# Patient Record
Sex: Male | Born: 1990 | Race: Black or African American | Hispanic: No | Marital: Single | Smoking: Current every day smoker
Health system: Southern US, Community
[De-identification: ages and names within clinical notes are randomized; demographics above are authoritative.]

## PROBLEM LIST (undated history)

## (undated) HISTORY — PX: WRIST SURGERY: SHX841

---
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
## 2009-10-04 ENCOUNTER — Emergency Department (HOSPITAL_BASED_OUTPATIENT_CLINIC_OR_DEPARTMENT_OTHER): Admission: EM | Admit: 2009-10-04 | Discharge: 2009-10-04 | Payer: Self-pay | Admitting: Emergency Medicine

## 2009-10-05 ENCOUNTER — Emergency Department (HOSPITAL_BASED_OUTPATIENT_CLINIC_OR_DEPARTMENT_OTHER): Admission: EM | Admit: 2009-10-05 | Discharge: 2009-10-05 | Payer: Self-pay | Admitting: Emergency Medicine

## 2009-11-08 ENCOUNTER — Emergency Department (HOSPITAL_BASED_OUTPATIENT_CLINIC_OR_DEPARTMENT_OTHER): Admission: EM | Admit: 2009-11-08 | Discharge: 2009-11-08 | Payer: Self-pay | Admitting: Emergency Medicine

## 2010-08-07 LAB — WOUND CULTURE: Culture: NO GROWTH

## 2010-08-09 ENCOUNTER — Emergency Department (INDEPENDENT_AMBULATORY_CARE_PROVIDER_SITE_OTHER): Payer: Self-pay

## 2010-08-09 ENCOUNTER — Emergency Department (HOSPITAL_BASED_OUTPATIENT_CLINIC_OR_DEPARTMENT_OTHER)
Admission: EM | Admit: 2010-08-09 | Discharge: 2010-08-09 | Disposition: A | Discharge status: Home / Self Care | Payer: Self-pay | Attending: Emergency Medicine | Admitting: Emergency Medicine

## 2010-08-09 DIAGNOSIS — F17200 Nicotine dependence, unspecified, uncomplicated: Secondary | ICD-10-CM | POA: Insufficient documentation

## 2010-08-09 DIAGNOSIS — R109 Unspecified abdominal pain: Secondary | ICD-10-CM

## 2010-08-09 DIAGNOSIS — R509 Fever, unspecified: Secondary | ICD-10-CM

## 2010-08-09 DIAGNOSIS — R197 Diarrhea, unspecified: Secondary | ICD-10-CM

## 2010-08-09 DIAGNOSIS — R112 Nausea with vomiting, unspecified: Secondary | ICD-10-CM

## 2010-08-09 LAB — COMPREHENSIVE METABOLIC PANEL
ALT: 34 U/L (ref 0–53)
Alkaline Phosphatase: 58 U/L (ref 39–117)
CO2: 22 mEq/L (ref 19–32)
Chloride: 105 mEq/L (ref 96–112)
GFR calc non Af Amer: >60 mL/min (ref >60)
Glucose, Bld: 115 mg/dL — ABNORMAL HIGH (ref 70–99)
Potassium: 3.9 mEq/L (ref 3.5–5.1)
Sodium: 141 mEq/L (ref 135–145)

## 2010-08-09 LAB — CBC
HCT: 46.7 % (ref 39.0–52.0)
Hemoglobin: 16 g/dL (ref 13.0–17.0)
WBC: 13.5 10*3/uL — ABNORMAL HIGH (ref 4.0–10.5)

## 2010-08-09 LAB — DIFFERENTIAL
Basophils Absolute: 0 10*3/uL (ref 0.0–0.1)
Lymphocytes Relative: 5 % — ABNORMAL LOW (ref 12–46)
Neutro Abs: 12.1 10*3/uL — ABNORMAL HIGH (ref 1.7–7.7)

## 2010-08-09 LAB — URINALYSIS, ROUTINE W REFLEX MICROSCOPIC
Ketones, ur: 15 mg/dL — AB
Nitrite: NEGATIVE
Urobilinogen, UA: 1 mg/dL (ref 0.0–1.0)
pH: 6 (ref 5.0–8.0)

## 2010-08-09 LAB — LIPASE, BLOOD: Lipase: 27 U/L (ref 23–300)

## 2010-08-09 MED ORDER — IOHEXOL 300 MG/ML  SOLN
100.0000 mL | Freq: Once | INTRAMUSCULAR | Status: AC | PRN
  Administered 2010-08-09: 100 mL via INTRAVENOUS

## 2010-10-03 ENCOUNTER — Emergency Department (HOSPITAL_BASED_OUTPATIENT_CLINIC_OR_DEPARTMENT_OTHER)
Admission: EM | Admit: 2010-10-03 | Discharge: 2010-10-03 | Disposition: A | Discharge status: Home / Self Care | Payer: No Typology Code available for payment source | Attending: Emergency Medicine | Admitting: Emergency Medicine

## 2010-10-03 DIAGNOSIS — Y92410 Unspecified street and highway as the place of occurrence of the external cause: Secondary | ICD-10-CM | POA: Insufficient documentation

## 2010-10-03 DIAGNOSIS — S335XXA Sprain of ligaments of lumbar spine, initial encounter: Secondary | ICD-10-CM | POA: Insufficient documentation

## 2011-03-20 ENCOUNTER — Encounter: Payer: Self-pay | Admitting: *Deleted

## 2011-03-20 ENCOUNTER — Emergency Department (HOSPITAL_BASED_OUTPATIENT_CLINIC_OR_DEPARTMENT_OTHER)
Admission: EM | Admit: 2011-03-20 | Discharge: 2011-03-20 | Disposition: A | Discharge status: Home / Self Care | Payer: Self-pay | Attending: Emergency Medicine | Admitting: Emergency Medicine

## 2011-03-20 DIAGNOSIS — W503XXA Accidental bite by another person, initial encounter: Secondary | ICD-10-CM

## 2011-03-20 DIAGNOSIS — S51809A Unspecified open wound of unspecified forearm, initial encounter: Secondary | ICD-10-CM | POA: Insufficient documentation

## 2011-03-20 DIAGNOSIS — F17200 Nicotine dependence, unspecified, uncomplicated: Secondary | ICD-10-CM | POA: Insufficient documentation

## 2011-03-20 MED ORDER — AMOXICILLIN-POT CLAVULANATE 875-125 MG PO TABS: 1.0000 | ORAL_TABLET | Freq: Two times a day (BID) | ORAL | Status: AC

## 2011-03-20 MED ORDER — AMOXICILLIN-POT CLAVULANATE 875-125 MG PO TABS
1.0000 | ORAL_TABLET | Freq: Once | ORAL | Status: AC
  Administered 2011-03-20: 1 via ORAL
  Filled 2011-03-20: qty 1

## 2011-03-20 MED ORDER — NAPROXEN 500 MG PO TABS: 500.0000 mg | ORAL_TABLET | Freq: Two times a day (BID) | ORAL | Status: AC
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
## 2011-05-08 ENCOUNTER — Emergency Department (INDEPENDENT_AMBULATORY_CARE_PROVIDER_SITE_OTHER): Payer: Self-pay

## 2011-05-08 ENCOUNTER — Encounter (HOSPITAL_BASED_OUTPATIENT_CLINIC_OR_DEPARTMENT_OTHER): Payer: Self-pay | Admitting: Emergency Medicine

## 2011-05-08 ENCOUNTER — Emergency Department (HOSPITAL_BASED_OUTPATIENT_CLINIC_OR_DEPARTMENT_OTHER)
Admission: EM | Admit: 2011-05-08 | Discharge: 2011-05-08 | Disposition: A | Discharge status: Home / Self Care | Payer: Self-pay | Attending: Emergency Medicine | Admitting: Emergency Medicine

## 2011-05-08 DIAGNOSIS — M549 Dorsalgia, unspecified: Secondary | ICD-10-CM | POA: Insufficient documentation

## 2011-05-08 DIAGNOSIS — J069 Acute upper respiratory infection, unspecified: Secondary | ICD-10-CM | POA: Insufficient documentation

## 2011-05-08 DIAGNOSIS — R05 Cough: Secondary | ICD-10-CM

## 2011-05-08 DIAGNOSIS — E669 Obesity, unspecified: Secondary | ICD-10-CM | POA: Insufficient documentation

## 2011-05-08 DIAGNOSIS — R509 Fever, unspecified: Secondary | ICD-10-CM

## 2011-05-08 DIAGNOSIS — M545 Low back pain: Secondary | ICD-10-CM

## 2011-05-08 MED ORDER — IBUPROFEN 400 MG PO TABS
600.0000 mg | ORAL_TABLET | Freq: Once | ORAL | Status: AC
  Administered 2011-05-08: 600 mg via ORAL
  Filled 2011-05-08: qty 1

## 2011-05-08 MED ORDER — HYDROCODONE-ACETAMINOPHEN 5-500 MG PO TABS: 1.0000 | ORAL_TABLET | Freq: Four times a day (QID) | ORAL | Status: AC | PRN

## 2011-05-08 MED ORDER — OXYCODONE-ACETAMINOPHEN 5-325 MG PO TABS
2.0000 | ORAL_TABLET | Freq: Once | ORAL | Status: AC
  Administered 2011-05-08: 2 via ORAL
  Filled 2011-05-08: qty 2

## 2011-05-08 MED ORDER — IBUPROFEN 600 MG PO TABS: 600.0000 mg | ORAL_TABLET | Freq: Three times a day (TID) | ORAL | Status: AC | PRN

## 2011-05-08 NOTE — ED Notes (Signed)
Pt states he has dry cough, diarrhea and lower back pain.  Some emesis on Sunday.  None since.  Some fever.  No dysuria.
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
## 2011-12-09 ENCOUNTER — Emergency Department (HOSPITAL_BASED_OUTPATIENT_CLINIC_OR_DEPARTMENT_OTHER)
Admission: EM | Admit: 2011-12-09 | Discharge: 2011-12-09 | Disposition: A | Discharge status: Home / Self Care | Payer: Self-pay | Attending: Emergency Medicine | Admitting: Emergency Medicine

## 2011-12-09 ENCOUNTER — Encounter (HOSPITAL_BASED_OUTPATIENT_CLINIC_OR_DEPARTMENT_OTHER): Payer: Self-pay | Admitting: *Deleted

## 2011-12-09 ENCOUNTER — Emergency Department (HOSPITAL_BASED_OUTPATIENT_CLINIC_OR_DEPARTMENT_OTHER): Payer: Self-pay

## 2011-12-09 DIAGNOSIS — M549 Dorsalgia, unspecified: Secondary | ICD-10-CM | POA: Insufficient documentation

## 2011-12-09 DIAGNOSIS — G43909 Migraine, unspecified, not intractable, without status migrainosus: Secondary | ICD-10-CM | POA: Insufficient documentation

## 2011-12-09 DIAGNOSIS — Z87891 Personal history of nicotine dependence: Secondary | ICD-10-CM | POA: Insufficient documentation

## 2011-12-09 DIAGNOSIS — N390 Urinary tract infection, site not specified: Secondary | ICD-10-CM | POA: Insufficient documentation

## 2011-12-09 LAB — URINALYSIS, ROUTINE W REFLEX MICROSCOPIC
Glucose, UA: NEGATIVE mg/dL
Ketones, ur: NEGATIVE mg/dL
pH: 6 (ref 5.0–8.0)

## 2011-12-09 LAB — URINE MICROSCOPIC-ADD ON

## 2011-12-09 MED ORDER — CIPROFLOXACIN HCL 500 MG PO TABS: 500.0000 mg | ORAL_TABLET | Freq: Two times a day (BID) | ORAL | Status: AC

## 2011-12-09 MED ORDER — KETOROLAC TROMETHAMINE 30 MG/ML IJ SOLN
60.0000 mg | Freq: Once | INTRAMUSCULAR | Status: AC
  Administered 2011-12-09: 60 mg via INTRAMUSCULAR

## 2011-12-09 MED ORDER — ONDANSETRON 4 MG PO TBDP
4.0000 mg | ORAL_TABLET | Freq: Once | ORAL | Status: AC
  Administered 2011-12-09: 4 mg via ORAL
  Filled 2011-12-09: qty 1

## 2011-12-09 MED ORDER — HYDROCODONE-ACETAMINOPHEN 5-500 MG PO TABS: 1.0000 | ORAL_TABLET | Freq: Four times a day (QID) | ORAL | Status: AC | PRN

## 2011-12-09 MED ORDER — KETOROLAC TROMETHAMINE 60 MG/2ML IM SOLN
INTRAMUSCULAR | Status: AC
  Filled 2011-12-09: qty 2

## 2011-12-09 MED ORDER — HYDROMORPHONE HCL PF 2 MG/ML IJ SOLN
2.0000 mg | Freq: Once | INTRAMUSCULAR | Status: AC
  Administered 2011-12-09: 2 mg via INTRAMUSCULAR
  Filled 2011-12-09: qty 1

## 2011-12-09 NOTE — ED Notes (Signed)
Frequent urinating,HA, generalized body aches down back and down L leg, no n/v, no fever
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
## 2012-07-03 ENCOUNTER — Emergency Department (HOSPITAL_BASED_OUTPATIENT_CLINIC_OR_DEPARTMENT_OTHER)
Admission: EM | Admit: 2012-07-03 | Discharge: 2012-07-03 | Disposition: A | Discharge status: Home / Self Care | Payer: Self-pay | Attending: Emergency Medicine | Admitting: Emergency Medicine

## 2012-07-03 ENCOUNTER — Encounter (HOSPITAL_BASED_OUTPATIENT_CLINIC_OR_DEPARTMENT_OTHER): Payer: Self-pay | Admitting: *Deleted

## 2012-07-03 ENCOUNTER — Emergency Department (HOSPITAL_BASED_OUTPATIENT_CLINIC_OR_DEPARTMENT_OTHER): Payer: Self-pay

## 2012-07-03 DIAGNOSIS — S8990XA Unspecified injury of unspecified lower leg, initial encounter: Secondary | ICD-10-CM | POA: Insufficient documentation

## 2012-07-03 DIAGNOSIS — X500XXA Overexertion from strenuous movement or load, initial encounter: Secondary | ICD-10-CM | POA: Insufficient documentation

## 2012-07-03 DIAGNOSIS — Z87891 Personal history of nicotine dependence: Secondary | ICD-10-CM | POA: Insufficient documentation

## 2012-07-03 DIAGNOSIS — Y939 Activity, unspecified: Secondary | ICD-10-CM | POA: Insufficient documentation

## 2012-07-03 DIAGNOSIS — M25569 Pain in unspecified knee: Secondary | ICD-10-CM

## 2012-07-03 DIAGNOSIS — S99919A Unspecified injury of unspecified ankle, initial encounter: Secondary | ICD-10-CM | POA: Insufficient documentation

## 2012-07-03 DIAGNOSIS — Y929 Unspecified place or not applicable: Secondary | ICD-10-CM | POA: Insufficient documentation

## 2012-07-03 MED ORDER — HYDROCODONE-ACETAMINOPHEN 5-325 MG PO TABS: 2.0000 | ORAL_TABLET | ORAL | Status: DC | PRN

## 2012-07-03 MED ORDER — HYDROCODONE-ACETAMINOPHEN 5-325 MG PO TABS
2.0000 | ORAL_TABLET | Freq: Once | ORAL | Status: AC
  Administered 2012-07-03: 2 via ORAL
  Filled 2012-07-03: qty 2
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
## 2012-07-03 NOTE — ED Provider Notes (Signed)
Medical screening examination/treatment/procedure(s) were performed by non-physician practitioner and as supervising physician I was immediately available for consultation/collaboration.   Carleene Cooper III, MD 07/03/12 2223
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
## 2013-01-14 ENCOUNTER — Encounter (HOSPITAL_BASED_OUTPATIENT_CLINIC_OR_DEPARTMENT_OTHER): Payer: Self-pay | Admitting: *Deleted

## 2013-01-14 ENCOUNTER — Emergency Department (HOSPITAL_BASED_OUTPATIENT_CLINIC_OR_DEPARTMENT_OTHER)
Admission: EM | Admit: 2013-01-14 | Discharge: 2013-01-14 | Disposition: A | Discharge status: Home / Self Care | Payer: Self-pay | Attending: Emergency Medicine | Admitting: Emergency Medicine

## 2013-01-14 DIAGNOSIS — L0231 Cutaneous abscess of buttock: Secondary | ICD-10-CM | POA: Insufficient documentation

## 2013-01-14 DIAGNOSIS — F17200 Nicotine dependence, unspecified, uncomplicated: Secondary | ICD-10-CM | POA: Insufficient documentation

## 2013-01-14 DIAGNOSIS — L0291 Cutaneous abscess, unspecified: Secondary | ICD-10-CM

## 2013-01-14 MED ORDER — TRAMADOL HCL 50 MG PO TABS: 50.0000 mg | ORAL_TABLET | Freq: Four times a day (QID) | ORAL | Status: DC | PRN

## 2013-01-14 MED ORDER — SULFAMETHOXAZOLE-TRIMETHOPRIM 800-160 MG PO TABS: 1.0000 | ORAL_TABLET | Freq: Two times a day (BID) | ORAL | Status: DC

## 2013-01-14 NOTE — ED Notes (Signed)
Pt c/o several abscess x 2 weeks

## 2013-01-14 NOTE — ED Provider Notes (Signed)
Medical screening examination/treatment/procedure(s) were performed by non-physician practitioner and as supervising physician I was immediately available for consultation/collaboration.   Gwyneth Sprout, MD 01/14/13 [phone]

## 2013-01-14 NOTE — ED Provider Notes (Signed)
CSN: [id number]     Arrival date & time 01/14/13  1402 History   First MD Initiated Contact with Patient 01/14/13 1419     Chief Complaint  Patient presents with  . Abscess   (Consider location/radiation/quality/duration/timing/severity/associated sxs/prior Treatment) HPI Comments: Patient is a 22 year old male who presents with a 2 week history of abscesses. One is located on his left lateral leg and the other started one week ago and is located on his left buttock. The one on his leg is not causing pain and "has been draining and getting better." The one on his buttock is painful and getting bigger. Patient not tried anything for relief. Palpation of the area makes the pain worse. Patient denies any other symptoms.    History reviewed. No pertinent past medical history. Past Surgical History  Procedure Laterality Date  . Wrist surgery      right   History reviewed. No pertinent family history. History  Substance Use Topics  . Smoking status: Current Every Day Smoker -- 0.50 packs/day    Types: Cigarettes  . Smokeless tobacco: Not on file  . Alcohol Use: No     Comment: occasional    Review of Systems  Skin: Positive for color change and wound.  All other systems reviewed and are negative.    Allergies  Review of patient's allergies indicates no known allergies.  Home Medications  No current outpatient prescriptions on file. BP 109/77  Pulse 92  Temp(Src) 98.8 F (37.1 C)  Resp 16  Ht 6\' 3"  (1.905 m)  Wt 300 lb (136.079 kg)  BMI 37.5 kg/m2  SpO2 97% Physical Exam  Nursing note and vitals reviewed. Constitutional: He is oriented to person, place, and time. He appears well-developed and well-nourished. No distress.  HENT:  Head: Normocephalic and atraumatic.  Eyes: Conjunctivae and EOM are normal.  Neck: Normal range of motion.  Cardiovascular: Normal rate and regular rhythm.  Exam reveals no gallop and no friction rub.   No murmur heard. Pulmonary/Chest: Effort  normal and breath sounds normal. He has no wheezes. He has no rales. He exhibits no tenderness.  Musculoskeletal: Normal range of motion.  Neurological: He is alert and oriented to person, place, and time. Coordination normal.  Speech is goal-oriented. Moves limbs without ataxia.   Skin: Skin is warm and dry.  Area of fluctuance with surrounding erythema on left buttock. Area of induration of left lateral lower leg without fluctuance.   Psychiatric: He has a normal mood and affect. His behavior is normal.    ED Course  Procedures (including critical care time)  INCISION AND DRAINAGE Performed by: Emilia Beck Consent: Verbal consent obtained. Risks and benefits: risks, benefits and alternatives were discussed Type: abscess  Body area: left buttock  Anesthesia: local infiltration  Incision was made with a scalpel.  Local anesthetic: lidocaine 2% without epinephrine  Anesthetic total: 2 ml  Complexity: complex Blunt dissection to break up loculations  Drainage: purulent  Drainage amount: 4 mL  Packing material: none  Patient tolerance: Patient tolerated the procedure well with no immediate complications.   Labs Review Labs Reviewed - No data to display Imaging Review No results found.  MDM   1. Abscess and cellulitis     2:30 PM Patient has abscess to left buttock and localized induration of left lateral lower leg. I will drain the abscess and patient will have PO antibiotics for cellulitis. Vitals stable and patient afebrile.   2:55 PM Abscess drained without complication.  Patient will be discharged with Bactrim and Tramadol. Patient instructed to return with worsening or concerning symptoms.     Emilia Beck, PA-C 01/14/13 1456

## 2013-09-24 ENCOUNTER — Encounter (HOSPITAL_BASED_OUTPATIENT_CLINIC_OR_DEPARTMENT_OTHER): Payer: Self-pay | Admitting: Emergency Medicine

## 2013-09-24 ENCOUNTER — Emergency Department (HOSPITAL_BASED_OUTPATIENT_CLINIC_OR_DEPARTMENT_OTHER)
Admission: EM | Admit: 2013-09-24 | Discharge: 2013-09-25 | Disposition: A | Discharge status: Home / Self Care | Payer: BC Managed Care – PPO | Attending: Emergency Medicine | Admitting: Emergency Medicine

## 2013-09-24 DIAGNOSIS — Z79899 Other long term (current) drug therapy: Secondary | ICD-10-CM | POA: Insufficient documentation

## 2013-09-24 DIAGNOSIS — E663 Overweight: Secondary | ICD-10-CM | POA: Insufficient documentation

## 2013-09-24 DIAGNOSIS — Z792 Long term (current) use of antibiotics: Secondary | ICD-10-CM | POA: Insufficient documentation

## 2013-09-24 DIAGNOSIS — F17200 Nicotine dependence, unspecified, uncomplicated: Secondary | ICD-10-CM | POA: Insufficient documentation

## 2013-09-24 DIAGNOSIS — M5430 Sciatica, unspecified side: Secondary | ICD-10-CM

## 2013-09-24 LAB — URINE MICROSCOPIC-ADD ON

## 2013-09-24 LAB — URINALYSIS, ROUTINE W REFLEX MICROSCOPIC
Bilirubin Urine: NEGATIVE
Glucose, UA: NEGATIVE mg/dL
Hgb urine dipstick: NEGATIVE
KETONES UR: NEGATIVE mg/dL
NITRITE: NEGATIVE
Protein, ur: NEGATIVE mg/dL
Specific Gravity, Urine: 1.023 (ref 1.005–1.030)
Urobilinogen, UA: 1 mg/dL (ref 0.0–1.0)
pH: 6 (ref 5.0–8.0)

## 2013-09-24 MED ORDER — OXYCODONE-ACETAMINOPHEN 5-325 MG PO TABS
1.0000 | ORAL_TABLET | Freq: Four times a day (QID) | ORAL | Status: DC | PRN
  Administered 2013-09-24: 1 via ORAL
  Filled 2013-09-24: qty 1

## 2013-09-24 MED ORDER — KETOROLAC TROMETHAMINE 60 MG/2ML IM SOLN
60.0000 mg | Freq: Once | INTRAMUSCULAR | Status: AC
  Administered 2013-09-24: 60 mg via INTRAMUSCULAR
  Filled 2013-09-24: qty 2

## 2013-09-24 NOTE — ED Notes (Signed)
C/o lower back pain radiating to upper back down leg x 1 month,  Took tylenol 1-2 times w no relief  Denies inj

## 2013-09-24 NOTE — ED Notes (Signed)
Lower back pain with radiation into his left leg x 1 month.

## 2013-09-24 NOTE — ED Provider Notes (Signed)
CSN: [id number]     Arrival date & time 09/24/13  2122 History  This chart was scribed for Dustin Batonourtney F Horton, MD by Luisa DagoPriscilla Tutu, ED Scribe. This patient was seen in room MH08/MH08 and the patient's care was started at 11:16 PM.    Chief Complaint  Patient presents with  . Back Pain   The history is provided by the patient. No language interpreter was used.   HPI Comments: Lanier PrudeDiante Q Pearce is a 23 y.o. male who presents to the Emergency Department complaining of worsening back pain that started 1 month ago. Pt states that the pain is mainly located in her lower back and it radiates down into her left leg. He states that he hasn't been doing any heavy lifting, but he states that the pain is worsened by sitting. Pt reports taking Tylenol with no relief. He denies any paraesthesia, recent injury, IV drug use, steroid use, bowel or bladder incontinence.   History reviewed. No pertinent past medical history. Past Surgical History  Procedure Laterality Date  . Wrist surgery      right   No family history on file. History  Substance Use Topics  . Smoking status: Current Every Day Smoker -- 0.50 packs/day    Types: Cigarettes  . Smokeless tobacco: Not on file  . Alcohol Use: No     Comment: occasional    Review of Systems  Genitourinary:       Denies urinary retention  Musculoskeletal: Positive for back pain.  Neurological: Negative for weakness and numbness.  All other systems reviewed and are negative.     Allergies  Review of patient's allergies indicates no known allergies.  Home Medications   Prior to Admission medications   Medication Sig Start Date End Date Taking? Authorizing Provider  sulfamethoxazole-trimethoprim (SEPTRA DS) 800-160 MG per tablet Take 1 tablet by mouth every 12 (twelve) hours. 01/14/13   Kaitlyn Szekalski, PA-C  traMADol (ULTRAM) 50 MG tablet Take 1 tablet (50 mg total) by mouth every 6 (six) hours as needed for pain. 01/14/13   Kaitlyn Szekalski, PA-C    BP 127/89  Pulse 65  Temp(Src) 98.1 F (36.7 C) (Oral)  Resp 18  Ht 6\' 4"  (1.93 m)  Wt 300 lb (136.079 kg)  BMI 36.53 kg/m2  SpO2 97%  Physical Exam  Nursing note and vitals reviewed. Constitutional: He is oriented to person, place, and time. He appears well-developed and well-nourished. No distress.  Overweight  HENT:  Head: Normocephalic and atraumatic.  Cardiovascular: Normal rate, regular rhythm and normal heart sounds.   No murmur heard. Pulmonary/Chest: Effort normal and breath sounds normal. No respiratory distress. He has no wheezes.  Musculoskeletal: He exhibits no edema.  Tenderness to palpation over the left SI joint, no midline lumbar tenderness, positive straight leg raise  Lymphadenopathy:    He has no cervical adenopathy.  Neurological: He is alert and oriented to person, place, and time.  Skin: Skin is warm and dry.  Psychiatric: He has a normal mood and affect.    ED Course  Procedures (including critical care time)  DIAGNOSTIC STUDIES: Oxygen Saturation is 97% on RA, normal by my interpretation.    COORDINATION OF CARE: 11:19 PM -Patient / Family / Caregiver informed of clinical course, understand medical decision-making process, and agree with plan.  Labs Review Labs Reviewed  URINALYSIS, ROUTINE W REFLEX MICROSCOPIC - Abnormal; Notable for the following:    Leukocytes, UA SMALL (*)    All other components within normal limits  URINE MICROSCOPIC-ADD ON - Abnormal; Notable for the following:    Bacteria, UA FEW (*)    All other components within normal limits    Imaging Review No results found.   EKG Interpretation None      MDM   Final diagnoses:  Sciatica    Patient presents with one month history of back pain. He is nontoxic and afebrile. No other red flags of back pain. History and physical exam most consistent with sciatica. Patient was given Percocet.  No indication for imaging at this time. Discuss with patient  anti-inflammatories. Patient will be given a Medrol Dosepak at discharge. Patient was also given referral to sports medicine.  After history, exam, and medical workup I feel the patient has been appropriately medically screened and is safe for discharge home. Pertinent diagnoses were discussed with the patient. Patient was given return precautions.   I personally performed the services described in this documentation, which was scribed in my presence. The recorded information has been reviewed and is accurate.    Dustin Batonourtney F Horton, MD 09/25/13 [phone]

## 2013-09-25 MED ORDER — METHYLPREDNISOLONE (PAK) 4 MG PO TABS: ORAL_TABLET | ORAL | Status: DC

## 2013-09-25 MED ORDER — OXYCODONE-ACETAMINOPHEN 5-325 MG PO TABS: 1.0000 | ORAL_TABLET | Freq: Four times a day (QID) | ORAL | Status: DC | PRN

## 2013-09-25 NOTE — Discharge Instructions (Signed)
Sciatica °Sciatica is pain, weakness, numbness, or tingling along the path of the sciatic nerve. The nerve starts in the lower back and runs down the back of each leg. The nerve controls the muscles in the lower leg and in the back of the knee, while also providing sensation to the back of the thigh, lower leg, and the sole of your foot. Sciatica is a symptom of another medical condition. For instance, nerve damage or certain conditions, such as a herniated disk or bone spur on the spine, pinch or put pressure on the sciatic nerve. This causes the pain, weakness, or other sensations normally associated with sciatica. Generally, sciatica only affects one side of the body. °CAUSES  °· Herniated or slipped disc. °· Degenerative disk disease. °· A pain disorder involving the narrow muscle in the buttocks (piriformis syndrome). °· Pelvic injury or fracture. °· Pregnancy. °· Tumor (rare). °SYMPTOMS  °Symptoms can vary from mild to very severe. The symptoms usually travel from the low back to the buttocks and down the back of the leg. Symptoms can include: °· Mild tingling or dull aches in the lower back, leg, or hip. °· Numbness in the back of the calf or sole of the foot. °· Burning sensations in the lower back, leg, or hip. °· Sharp pains in the lower back, leg, or hip. °· Leg weakness. °· Severe back pain inhibiting movement. °These symptoms may get worse with coughing, sneezing, laughing, or prolonged sitting or standing. Also, being overweight may worsen symptoms. °DIAGNOSIS  °Your caregiver will perform a physical exam to look for common symptoms of sciatica. He or she may ask you to do certain movements or activities that would trigger sciatic nerve pain. Other tests may be performed to find the cause of the sciatica. These may include: °· Blood tests. °· X-rays. °· Imaging tests, such as an MRI or CT scan. °TREATMENT  °Treatment is directed at the cause of the sciatic pain. Sometimes, treatment is not necessary  and the pain and discomfort goes away on its own. If treatment is needed, your caregiver may suggest: °· Over-the-counter medicines to relieve pain. °· Prescription medicines, such as anti-inflammatory medicine, muscle relaxants, or narcotics. °· Applying heat or ice to the painful area. °· Steroid injections to lessen pain, irritation, and inflammation around the nerve. °· Reducing activity during periods of pain. °· Exercising and stretching to strengthen your abdomen and improve flexibility of your spine. Your caregiver may suggest losing weight if the extra weight makes the back pain worse. °· Physical therapy. °· Surgery to eliminate what is pressing or pinching the nerve, such as a bone spur or part of a herniated disk. °HOME CARE INSTRUCTIONS  °· Only take over-the-counter or prescription medicines for pain or discomfort as directed by your caregiver. °· Apply ice to the affected area for 20 minutes, 3 4 times a day for the first 48 72 hours. Then try heat in the same way. °· Exercise, stretch, or perform your usual activities if these do not aggravate your pain. °· Attend physical therapy sessions as directed by your caregiver. °· Keep all follow-up appointments as directed by your caregiver. °· Do not wear high heels or shoes that do not provide proper support. °· Check your mattress to see if it is too soft. A firm mattress may lessen your pain and discomfort. °SEEK IMMEDIATE MEDICAL CARE IF:  °· You lose control of your bowel or bladder (incontinence). °· You have increasing weakness in the lower back,   pelvis, buttocks, or legs. °· You have redness or swelling of your back. °· You have a burning sensation when you urinate. °· You have pain that gets worse when you lie down or awakens you at night. °· Your pain is worse than you have experienced in the past. °· Your pain is lasting longer than 4 weeks. °· You are suddenly losing weight without reason. °MAKE SURE YOU: °· Understand these  instructions. °· Will watch your condition. °· Will get help right away if you are not doing well or get worse. °Document Released: 05/01/2001 Document Revised: 11/06/2011 Document Reviewed: 09/16/2011 °[employer]® Patient Information ©2014 [employer]. ° °

## 2013-10-19 ENCOUNTER — Emergency Department (HOSPITAL_BASED_OUTPATIENT_CLINIC_OR_DEPARTMENT_OTHER)
Admission: EM | Admit: 2013-10-19 | Discharge: 2013-10-19 | Disposition: A | Discharge status: Home / Self Care | Payer: BC Managed Care – PPO | Attending: Emergency Medicine | Admitting: Emergency Medicine

## 2013-10-19 ENCOUNTER — Encounter (HOSPITAL_BASED_OUTPATIENT_CLINIC_OR_DEPARTMENT_OTHER): Payer: Self-pay | Admitting: Emergency Medicine

## 2013-10-19 DIAGNOSIS — G5700 Lesion of sciatic nerve, unspecified lower limb: Secondary | ICD-10-CM | POA: Insufficient documentation

## 2013-10-19 DIAGNOSIS — M545 Low back pain, unspecified: Secondary | ICD-10-CM | POA: Insufficient documentation

## 2013-10-19 DIAGNOSIS — M549 Dorsalgia, unspecified: Secondary | ICD-10-CM

## 2013-10-19 DIAGNOSIS — F17200 Nicotine dependence, unspecified, uncomplicated: Secondary | ICD-10-CM | POA: Insufficient documentation

## 2013-10-19 MED ORDER — NAPROXEN 500 MG PO TABS: 500.0000 mg | ORAL_TABLET | Freq: Two times a day (BID) | ORAL | Status: DC

## 2013-10-19 MED ORDER — HYDROCODONE-ACETAMINOPHEN 5-325 MG PO TABS: 2.0000 | ORAL_TABLET | ORAL | Status: DC | PRN

## 2013-10-19 NOTE — ED Notes (Signed)
Pt treated 3 weeks ago for sciatica.  Reports no change in pain.  (L) sided pain

## 2013-10-19 NOTE — ED Notes (Signed)
D/c home with ride- pt directed to pharmacy to pick prescriptions- rx given to pt by Dr. Fayrene Fearing for PT
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
## 2013-10-19 NOTE — Discharge Instructions (Signed)
Call number on Rx for [facility] patient [facility] (Physical therapy).  Back Pain, Adult Back pain is very common. The pain often gets better over time. The cause of back pain is usually not dangerous. Most people can learn to manage their back pain on their own.  HOME CARE   Stay active. Start with short walks on flat ground if you can. Try to walk farther each day.  Do not sit, drive, or stand in one place for more than 30 minutes. Do not stay in bed.  Do not avoid exercise or work. Activity can help your back heal faster.  Be careful when you bend or lift an object. Bend at your knees, keep the object close to you, and do not twist.  Sleep on a firm mattress. Lie on your side, and bend your knees. If you lie on your back, put a pillow under your knees.  Only take medicines as told by your doctor.  Put ice on the injured area.  Put ice in a plastic bag.  Place a towel between your skin and the bag.  Leave the ice on for 15-20 minutes, 03-04 times a day for the first 2 to 3 days. After that, you can switch between ice and heat packs.  Ask your doctor about back exercises or massage.  Avoid feeling anxious or stressed. Find good ways to deal with stress, such as exercise. GET HELP RIGHT AWAY IF:   Your pain does not go away with rest or medicine.  Your pain does not go away in 1 week.  You have new problems.  You do not feel well.  The pain spreads into your legs.  You cannot control when you poop (bowel movement) or pee (urinate).  Your arms or legs feel weak or lose feeling (numbness).  You feel sick to your stomach (nauseous) or throw up (vomit).  You have belly (abdominal) pain.  You feel like you may pass out (faint). MAKE SURE YOU:   Understand these instructions.  Will watch your condition.  Will get help right away if you are not doing well or get worse. Document Released: 10/24/2007 Document Revised: 07/30/2011 Document Reviewed:  09/25/2010 [facility] Patient Information 2014 [employer], [place].  Piriformis Syndrome with Rehab Piriformis syndrome is a condition the affects the nervous system in the area of the hip, and is characterized by pain and possibly a loss of feeling in the backside (posterior) thigh that may extend down the entire length of the leg. The symptoms are caused by an increase in pressure on the sciatic nerve by the piriformis muscle, which is on the back of the hip and is responsible for externally rotating the hip. The sciatic nerve and its branches connect to much of the leg. Normally the sciatic nerve runs between the piriformis muscle and other muscles. However, in certain individuals the nerve runs through the muscle, which causes an increase in pressure on the nerve and results in the symptoms of piriformis syndrome. SYMPTOMS   Pain, tingling, numbness, or burning in the back of the thigh that may also extend down the entire leg.  Occasionally, tenderness in the buttock.  Loss of function of the leg.  Pain that worsens when using the piriformis muscle (running, jumping, or stairs).  Pain that increases with prolonged sitting.  Pain that is lessened by laying flat on the back. CAUSES   Piriformis syndrome is the result of an increase in pressure placed on the sciatic nerve. Often times piriformis syndrome  is an overuse injury.  Stress placed on the nerve from a sudden increase in the intensity, frequency, or duration of training.  Compensation of other extremity injuries. RISK INCREASES WITH:  Sports that involve the piriformis muscle (running, walking or jumping).  You are born with (congenital) a defect in which the sciatic nerve passes through the muscle. PREVENTION  Warm up and stretch properly before activity.  Allow for adequate recovery between workouts.  Maintain physical fitness:  Strength, flexibility, and endurance.  Cardiovascular fitness. PROGNOSIS  If treated  properly, then the symptoms of piriformis syndrome usually resolve in 2 to 6 weeks. RELATED COMPLICATIONS   Persistent and possibly permanent pain and numbness in the lower extremity.  Weakness of the extremity that may progress to disability and inability to compete. TREATMENT  The most effective treatment for piriformis syndrome is rest from any activities that aggravate the symptoms. Ice and pain medication may help reduce pain and inflammation. The use of strengthening and stretching exercises may help reduce pain with activity. These exercises may be performed at home or with a therapist. A referral to a therapist may be given for further evaluation and treatment, such as ultrasound. Corticosteroid injections may be given to reduce inflammation that is causing pressure to be placed on the sciatic nerve. If non-surgical (conservative) treatment is unsuccessful, then surgery may be recommended.  MEDICATION   If pain medication is necessary, then nonsteroidal anti-inflammatory medications, such as aspirin and ibuprofen, or other minor pain relievers, such as acetaminophen, are often recommended.  Do not take pain medication for 7 days before surgery.  Prescription pain relievers may be given if deemed necessary by your caregiver. Use only as directed and only as much as you need.  Corticosteroid injections may be given by your caregiver. These injections should be reserved for the most serious cases, because they may only be given a certain number of times. HEAT AND COLD:   Cold treatment (icing) relieves pain and reduces inflammation. Cold treatment should be applied for 10 to 15 minutes every 2 to 3 hours for inflammation and pain and immediately after any activity that aggravates your symptoms. Use ice packs or massage the area with a piece of ice (ice massage).  Heat treatment may be used prior to performing the stretching and strengthening activities prescribed by your caregiver, physical  therapist, or athletic trainer. Use a heat pack or soak the injury in warm water. SEEK IMMEDIATE MEDICAL CARE IF:  Treatment seems to offer no benefit, or the condition worsens.  Any medications produce adverse side effects.

## 2013-11-30 ENCOUNTER — Emergency Department (HOSPITAL_BASED_OUTPATIENT_CLINIC_OR_DEPARTMENT_OTHER)
Admission: EM | Admit: 2013-11-30 | Discharge: 2013-11-30 | Disposition: A | Discharge status: Home / Self Care | Payer: BC Managed Care – PPO | Attending: Emergency Medicine | Admitting: Emergency Medicine

## 2013-11-30 ENCOUNTER — Encounter (HOSPITAL_BASED_OUTPATIENT_CLINIC_OR_DEPARTMENT_OTHER): Payer: Self-pay | Admitting: Emergency Medicine

## 2013-11-30 DIAGNOSIS — M545 Low back pain: Secondary | ICD-10-CM

## 2013-11-30 DIAGNOSIS — Z79899 Other long term (current) drug therapy: Secondary | ICD-10-CM | POA: Insufficient documentation

## 2013-11-30 DIAGNOSIS — M5430 Sciatica, unspecified side: Secondary | ICD-10-CM | POA: Insufficient documentation

## 2013-11-30 DIAGNOSIS — N390 Urinary tract infection, site not specified: Secondary | ICD-10-CM | POA: Insufficient documentation

## 2013-11-30 DIAGNOSIS — Z791 Long term (current) use of non-steroidal anti-inflammatories (NSAID): Secondary | ICD-10-CM | POA: Insufficient documentation

## 2013-11-30 DIAGNOSIS — F17200 Nicotine dependence, unspecified, uncomplicated: Secondary | ICD-10-CM | POA: Insufficient documentation

## 2013-11-30 LAB — URINALYSIS, ROUTINE W REFLEX MICROSCOPIC
Bilirubin Urine: NEGATIVE
Glucose, UA: NEGATIVE mg/dL
Hgb urine dipstick: NEGATIVE
Ketones, ur: NEGATIVE mg/dL
NITRITE: NEGATIVE
PROTEIN: NEGATIVE mg/dL
SPECIFIC GRAVITY, URINE: 1.011 (ref 1.005–1.030)
UROBILINOGEN UA: 1 mg/dL (ref 0.0–1.0)
pH: 7 (ref 5.0–8.0)

## 2013-11-30 LAB — URINE MICROSCOPIC-ADD ON

## 2013-11-30 MED ORDER — OXYCODONE-ACETAMINOPHEN 5-325 MG PO TABS
1.0000 | ORAL_TABLET | ORAL | Status: DC | PRN
Start: 2013-11-30 — End: 2014-05-24

## 2013-11-30 MED ORDER — CEPHALEXIN 500 MG PO CAPS: 500.0000 mg | ORAL_CAPSULE | Freq: Four times a day (QID) | ORAL | Status: DC

## 2013-11-30 MED ORDER — METHOCARBAMOL 500 MG PO TABS: 1000.0000 mg | ORAL_TABLET | Freq: Two times a day (BID) | ORAL | Status: DC
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
## 2013-11-30 NOTE — ED Provider Notes (Signed)
CSN: [id number]     Arrival date & time 11/30/13  [id number] History   First MD Initiated Contact with Patient 11/30/13 [phone]     Chief Complaint  Patient presents with  . Back Pain     (Consider location/radiation/quality/duration/timing/severity/associated sxs/prior Treatment) Patient is a 23 y.o. male presenting with back pain. The history is provided by the patient.  Back Pain  patient here 24 hours of left-sided back pain. Denies any dysuria hematuria. No fever or chills. History of back pain in the past has been seen multiple times for same. Pain does radiate down his left leg and is characterized as sharp and worse with standing. 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 Denies any new injury to his back. No red flags for  cauda equina. Symptoms persisted and no treatment used prior to arrival. They're also better with rest.  History reviewed. No pertinent past medical history. Past Surgical History  Procedure Laterality Date  . Wrist surgery      right   History reviewed. No pertinent family history. History  Substance Use Topics  . Smoking status: Current Every Day Smoker -- 0.50 packs/day    Types: Cigarettes  . Smokeless tobacco: Not on file  . Alcohol Use: No     Comment: occasional    Review of Systems  Musculoskeletal: Positive for back pain.  All other systems reviewed and are negative.     Allergies  Review of patient's allergies indicates no known allergies.  Home Medications   Prior to Admission medications   Medication Sig Start Date End Date Taking? Authorizing Provider  cephALEXin (KEFLEX) 500 MG capsule Take 1 capsule (500 mg total) by mouth 4 (four) times daily. 11/30/13   Toy BakerAnthony T Jerusalem Brownstein, MD  HYDROcodone-acetaminophen (NORCO/VICODIN) 5-325 MG per tablet Take 2 tablets by mouth every 4 (four) hours as needed. 10/19/13   Rolland PorterMark James, MD  methocarbamol (ROBAXIN) 500 MG tablet Take 2 tablets (1,000 mg total) by  mouth 2 (two) times daily. 11/30/13   Toy BakerAnthony T Macallan Ord, MD  naproxen (NAPROSYN) 500 MG tablet Take 1 tablet (500 mg total) by mouth 2 (two) times daily. 10/19/13   Rolland PorterMark James, MD  oxyCODONE-acetaminophen (PERCOCET/ROXICET) 5-325 MG per tablet Take 1-2 tablets by mouth every 4 (four) hours as needed for severe pain. 11/30/13   Toy BakerAnthony T Katheline Brendlinger, MD   BP 131/74  Pulse 65  Temp(Src) 99.7 F (37.6 C) (Oral)  Resp 16  Ht 6' 3.5" (1.918 m)  Wt 310 lb (140.615 kg)  BMI 38.22 kg/m2  SpO2 99% Physical Exam  Nursing note and vitals reviewed. Constitutional: He is oriented to person, place, and time. He appears well-developed and well-nourished.  Non-toxic appearance. No distress.  HENT:  Head: Normocephalic and atraumatic.  Eyes: Conjunctivae, EOM and lids are normal. Pupils are equal, round, and reactive to light.  Neck: Normal range of motion. Neck supple. No tracheal deviation present. No mass present.  Cardiovascular: Normal rate, regular rhythm and normal heart sounds.  Exam reveals no gallop.   No murmur heard. Pulmonary/Chest: Effort normal and breath sounds normal. No stridor. No respiratory distress. He has no decreased breath sounds. He has no wheezes. He has no rhonchi. He has no rales.  Abdominal: Soft. Normal appearance and bowel sounds are normal. He exhibits no distension. There is no tenderness. There is no rebound and no CVA tenderness.  Musculoskeletal: Normal range of motion. He exhibits no edema and  no tenderness.       Back:  Neurological: He is alert and oriented to person, place, and time. He has normal strength. No cranial nerve deficit or sensory deficit. GCS eye subscore is 4. GCS verbal subscore is 5. GCS motor subscore is 6.  Skin: Skin is warm and dry. No abrasion and no rash noted.  Psychiatric: He has a normal mood and affect. His speech is normal and behavior is normal.    ED Course  Procedures (including critical care time) Labs Review Labs Reviewed  URINALYSIS,  ROUTINE W REFLEX MICROSCOPIC - Abnormal; Notable for the following:    Leukocytes, UA SMALL (*)    All other components within normal limits  URINE MICROSCOPIC-ADD ON - Abnormal; Notable for the following:    Squamous Epithelial / LPF FEW (*)    All other components within normal limits    Imaging Review No results found.   EKG Interpretation None      MDM   Final diagnoses:  UTI (lower urinary tract infection)  Low back pain, unspecified back pain laterality, with sciatica presence unspecified    Patient with evidence of UTI on urinalysis and we treated for such. He also treated for suspected lumbar disc disease. Encouraged to followup with his Dr.    Toy Baker, MD 11/30/13 [phone]

## 2013-11-30 NOTE — Discharge Instructions (Signed)
Back Pain, Adult °Low back pain is very common. About 1 in 5 people have back pain. The cause of low back pain is rarely dangerous. The pain often gets better over time. About half of people with a sudden onset of back pain feel better in just 2 weeks. About 8 in 10 people feel better by 6 weeks.  °CAUSES °Some common causes of back pain include: °· Strain of the muscles or ligaments supporting the spine. °· Wear and tear (degeneration) of the spinal discs. °· Arthritis. °· Direct injury to the back. °DIAGNOSIS °Most of the time, the direct cause of low back pain is not known. However, back pain can be treated effectively even when the exact cause of the pain is unknown. Answering your caregiver's questions about your overall health and symptoms is one of the most accurate ways to make sure the cause of your pain is not dangerous. If your caregiver needs more information, he or she may order lab work or imaging tests (X-rays or MRIs). However, even if imaging tests show changes in your back, this usually does not require surgery. °HOME CARE INSTRUCTIONS °For many people, back pain returns. Since low back pain is rarely dangerous, it is often a condition that people can learn to manage on their own.  °· Remain active. It is stressful on the back to sit or stand in one place. Do not sit, drive, or stand in one place for more than 30 minutes at a time. Take short walks on level surfaces as soon as pain allows. Try to increase the length of time you walk each day. °· Do not stay in bed. Resting more than 1 or 2 days can delay your recovery. °· Do not avoid exercise or work. Your body is made to move. It is not dangerous to be active, even though your back may hurt. Your back will likely heal faster if you return to being active before your pain is gone. °· Pay attention to your body when you  bend and lift. Many people have less discomfort when lifting if they bend their knees, keep the load close to their bodies, and  avoid twisting. Often, the most comfortable positions are those that put less stress on your recovering back. °· Find a comfortable position to sleep. Use a firm mattress and lie on your side with your knees slightly bent. If you lie on your back, put a pillow under your knees. °· Only take over-the-counter or prescription medicines as directed by your caregiver. Over-the-counter medicines to reduce pain and inflammation are often the most helpful. Your caregiver may prescribe muscle relaxant drugs. These medicines help dull your pain so you can more quickly return to your normal activities and healthy exercise. °· Put ice on the injured area. °¨ Put ice in a plastic bag. °¨ Place a towel between your skin and the bag. °¨ Leave the ice on for 15-20 minutes, 03-04 times a day for the first 2 to 3 days. After that, ice and heat may be alternated to reduce pain and spasms. °· Ask your caregiver about trying back exercises and gentle massage. This may be of some benefit. °· Avoid feeling anxious or stressed. Stress increases muscle tension and can worsen back pain. It is important to recognize when you are anxious or stressed and learn ways to manage it. Exercise is a great option. °SEEK MEDICAL CARE IF: °· You have pain that is not relieved with rest or medicine. °· You have pain that does not improve in 1 week. °· You have new symptoms. °· You are generally not feeling well. °SEEK   IMMEDIATE MEDICAL CARE IF:  °· You have pain that radiates from your back into your legs. °· You develop new bowel or bladder control problems. °· You have unusual weakness or numbness in your arms or legs. °· You develop nausea or vomiting. °· You develop abdominal pain. °· You feel faint. °Document Released: 05/07/2005 Document Revised: 11/06/2011 Document Reviewed: 09/25/2010 °ExitCare® Patient Information ©2015 [employer]. This information is not intended to replace advice given to you by your health care provider. Make sure you  discuss any questions you have with your health care provider. ° °Urinary Tract Infection °Urinary tract infections (UTIs) can develop anywhere along your urinary tract. Your urinary tract is your body's drainage system for removing wastes and extra water. Your urinary tract includes two kidneys, two ureters, a bladder, and a urethra. Your kidneys are a pair of bean-shaped organs. Each kidney is about the size of your fist. They are located below your ribs, one on each side of your spine. °CAUSES °Infections are caused by microbes, which are microscopic organisms, including fungi, viruses, and bacteria. These organisms are so small that they can only be seen through a microscope. Bacteria are the microbes that most commonly cause UTIs. °SYMPTOMS  °Symptoms of UTIs may vary by age and gender of the patient and by the location of the infection. Symptoms in young women typically include a frequent and intense urge to urinate and a painful, burning feeling in the bladder or urethra during urination. Older women and men are more likely to be tired, shaky, and weak and have muscle aches and abdominal pain. A fever may mean the infection is in your kidneys. Other symptoms of a kidney infection include pain in your back or sides below the ribs, nausea, and vomiting. °DIAGNOSIS °To diagnose a UTI, your caregiver will ask you about your symptoms. Your caregiver also will ask to provide a urine sample. The urine sample will be tested for bacteria and white blood cells. White blood cells are made by your body to help fight infection. °TREATMENT  °Typically, UTIs can be treated with medication. Because most UTIs are caused by a bacterial infection, they usually can be treated with the use of antibiotics. The choice of antibiotic and length of treatment depend on your symptoms and the type of bacteria causing your infection. °HOME CARE INSTRUCTIONS °· If you were prescribed antibiotics, take them exactly as your caregiver  instructs you. Finish the medication even if you feel better after you have only taken some of the medication. °· Drink enough water and fluids to keep your urine clear or pale yellow. °· Avoid caffeine, tea, and carbonated beverages. They tend to irritate your bladder. °· Empty your bladder often. Avoid holding urine for long periods of time. °· Empty your bladder before and after sexual intercourse. °· After a bowel movement, women should cleanse from front to back. Use each tissue only once. °SEEK MEDICAL CARE IF:  °· You have back pain. °· You develop a fever. °· Your symptoms do not begin to resolve within 3 days. °SEEK IMMEDIATE MEDICAL CARE IF:  °· You have severe back pain or lower abdominal pain. °· You develop chills. °· You have nausea or vomiting. °· You have continued burning or discomfort with urination. °MAKE SURE YOU:  °· Understand these instructions. °· Will watch your condition. °· Will get help right away if you are not doing well or get worse. °Document Released: 02/14/2005 Document Revised: 11/06/2011 Document Reviewed: 06/15/2011 °ExitCare® Patient Information ©  2015 [employer]. This information is not intended to replace advice given to you by your health care provider. Make sure you discuss any questions you have with your health care provider. ° °

## 2014-05-23 ENCOUNTER — Encounter (HOSPITAL_BASED_OUTPATIENT_CLINIC_OR_DEPARTMENT_OTHER): Payer: Self-pay | Admitting: *Deleted

## 2014-05-23 DIAGNOSIS — Z791 Long term (current) use of non-steroidal anti-inflammatories (NSAID): Secondary | ICD-10-CM | POA: Insufficient documentation

## 2014-05-23 DIAGNOSIS — Z792 Long term (current) use of antibiotics: Secondary | ICD-10-CM | POA: Insufficient documentation

## 2014-05-23 DIAGNOSIS — Z720 Tobacco use: Secondary | ICD-10-CM | POA: Insufficient documentation

## 2014-05-23 DIAGNOSIS — J029 Acute pharyngitis, unspecified: Secondary | ICD-10-CM | POA: Diagnosis not present

## 2014-05-23 DIAGNOSIS — Z79899 Other long term (current) drug therapy: Secondary | ICD-10-CM | POA: Insufficient documentation

## 2014-05-23 LAB — RAPID STREP SCREEN (MED CTR MEBANE ONLY): Streptococcus, Group A Screen (Direct): NEGATIVE

## 2014-05-23 MED ORDER — OXYCODONE-ACETAMINOPHEN 5-325 MG PO TABS
1.0000 | ORAL_TABLET | Freq: Once | ORAL | Status: AC
  Administered 2014-05-23: 1 via ORAL
  Filled 2014-05-23: qty 1

## 2014-05-23 MED ORDER — IBUPROFEN 400 MG PO TABS
400.0000 mg | ORAL_TABLET | Freq: Once | ORAL | Status: AC
  Administered 2014-05-23: 400 mg via ORAL
  Filled 2014-05-23: qty 1

## 2014-05-23 NOTE — ED Notes (Addendum)
C/o sore throat, onset 12/31, hurts to swallow, decreased PO input d/t pain. [facility] in jail on [place], seen by correctional nurse for same, do not know results of test done then.

## 2014-05-24 ENCOUNTER — Emergency Department (HOSPITAL_BASED_OUTPATIENT_CLINIC_OR_DEPARTMENT_OTHER)
Admission: EM | Admit: 2014-05-24 | Discharge: 2014-05-24 | Disposition: A | Discharge status: Home / Self Care | Payer: Managed Care, Other (non HMO) | Attending: Emergency Medicine | Admitting: Emergency Medicine

## 2014-05-24 DIAGNOSIS — J029 Acute pharyngitis, unspecified: Secondary | ICD-10-CM

## 2014-05-24 LAB — MONONUCLEOSIS SCREEN: Mono Screen: NEGATIVE

## 2014-05-24 MED ORDER — SODIUM CHLORIDE 0.9 % IV BOLUS (SEPSIS)
1000.0000 mL | Freq: Once | INTRAVENOUS | Status: AC
  Administered 2014-05-24: 1000 mL via INTRAVENOUS

## 2014-05-24 MED ORDER — CLINDAMYCIN HCL 300 MG PO CAPS: 300.0000 mg | ORAL_CAPSULE | Freq: Three times a day (TID) | ORAL | Status: DC

## 2014-05-24 MED ORDER — DEXAMETHASONE SODIUM PHOSPHATE 10 MG/ML IJ SOLN
10.0000 mg | Freq: Once | INTRAMUSCULAR | Status: AC
  Administered 2014-05-24: 10 mg via INTRAVENOUS
  Filled 2014-05-24: qty 1

## 2014-05-24 MED ORDER — HYDROCODONE-ACETAMINOPHEN 7.5-325 MG/15ML PO SOLN: 10.0000 mL | Freq: Four times a day (QID) | ORAL | Status: DC | PRN

## 2014-05-24 NOTE — ED Provider Notes (Signed)
CSN: [id number]     Arrival date & time 05/23/14  2314 History   First MD Initiated Contact with Patient 05/24/14 0234     Chief Complaint  Patient presents with  . Sore Throat     (Consider location/radiation/quality/duration/timing/severity/associated sxs/prior Treatment) HPI Comments: Patient presents with a sore throat. He's been complaining of worsening sore throat for the last 5 days. He states that it's hard to swallow. He's had decreased by mouth intake. He denies any nausea or vomiting. He's had some subjective fevers. He denies any cough or cold symptoms. He states he was evaluated for this on December 31 but hasn't gotten better since that time. He's been using over-the-counter medicines without relief.  Patient is a 24 y.o. male presenting with pharyngitis.  Sore Throat Pertinent negatives include no chest pain, no abdominal pain, no headaches and no shortness of breath.    History reviewed. No pertinent past medical history. Past Surgical History  Procedure Laterality Date  . Wrist surgery      right   No family history on file. History  Substance Use Topics  . Smoking status: Current Every Day Smoker -- 0.50 packs/day    Types: Cigarettes  . Smokeless tobacco: Not on file  . Alcohol Use: Yes     Comment: occasional    Review of Systems  Constitutional: Positive for fatigue. Negative for fever, chills and diaphoresis.  HENT: Positive for sore throat and trouble swallowing. Negative for congestion, rhinorrhea and sneezing.   Eyes: Negative.   Respiratory: Negative for cough, chest tightness and shortness of breath.   Cardiovascular: Negative for chest pain and leg swelling.  Gastrointestinal: Negative for nausea, vomiting, abdominal pain, diarrhea and blood in stool.  Genitourinary: Negative for frequency, hematuria, flank pain and difficulty urinating.  Musculoskeletal: Negative for back pain and arthralgias.  Skin: Negative for rash.  Neurological: Negative for  dizziness, speech difficulty, weakness, numbness and headaches.      Allergies  Review of patient's allergies indicates no known allergies.  Home Medications   Prior to Admission medications   Medication Sig Start Date End Date Taking? Authorizing Provider  cephALEXin (KEFLEX) 500 MG capsule Take 1 capsule (500 mg total) by mouth 4 (four) times daily. 11/30/13   Toy Baker, MD  clindamycin (CLEOCIN) 300 MG capsule Take 1 capsule (300 mg total) by mouth 3 (three) times daily. X 7 days 05/24/14   Rolan Bucco, MD  HYDROcodone-acetaminophen (HYCET) 7.5-325 mg/15 ml solution Take 10 mLs by mouth 4 (four) times daily as needed for moderate pain. 05/24/14 05/24/15  Rolan Bucco, MD  methocarbamol (ROBAXIN) 500 MG tablet Take 2 tablets (1,000 mg total) by mouth 2 (two) times daily. 11/30/13   Toy Baker, MD  naproxen (NAPROSYN) 500 MG tablet Take 1 tablet (500 mg total) by mouth 2 (two) times daily. 10/19/13   Rolland Porter, MD   BP 104/55 mmHg  Pulse 43  Temp(Src) 97.9 F (36.6 C) (Oral)  Resp 18  Ht  (1.905 m)  Wt 311 lb (141.069 kg)  BMI 38.87 kg/m2  SpO2 94% Physical Exam  Constitutional: He is oriented to person, place, and time. He appears well-developed and well-nourished.  HENT:  Head: Normocephalic and atraumatic.  Patient has erythema and exudates the tonsils bilaterally. He has moderate swelling to the peritonsillar areas bilaterally but uvula is midline with more diffuse swelling rather than localized swelling. No trismus, uvula is midline  Eyes: Pupils are equal, round, and reactive to light.  Neck: Normal range of motion. Neck supple. No tracheal deviation present.  Cardiovascular: Normal rate, regular rhythm and normal heart sounds.   Pulmonary/Chest: Effort normal and breath sounds normal. No respiratory distress. He has no wheezes. He has no rales. He exhibits no tenderness.  Abdominal: Soft. Bowel sounds are normal. There is no tenderness. There is no rebound and no  guarding.  Musculoskeletal: Normal range of motion. He exhibits no edema.  Lymphadenopathy:    He has no cervical adenopathy.  Neurological: He is alert and oriented to person, place, and time.  Skin: Skin is warm and dry. No rash noted.  Psychiatric: He has a normal mood and affect.    ED Course  Procedures (including critical care time) Labs Review Labs Reviewed  RAPID STREP SCREEN  CULTURE, GROUP A STREP  MONONUCLEOSIS SCREEN    Imaging Review No results found.   EKG Interpretation None      MDM   Final diagnoses:  Pharyngitis    Patient's rapid strep is negative. His Monospot is negative. He initially seemed to have a large amount of swelling and a little bit of a muffling of his voice. He was given a dose of Decadron in the ED and was monitored for several hours. He was feeling better after this. On reevaluation he was laying virtually flat in the bed with no difficulty controlling secretions. His voice seems to be at baseline. Don't see any suggestions of a peritonsillar abscess. I will go ahead and start him on antibiotics and Lortab elixir. I advised him return here if he has any worsening symptoms or increased difficulty swallowing.    Rolan Bucco, MD 05/24/14 [phone]

## 2014-05-24 NOTE — ED Notes (Signed)
Dr. Belfi at [facility] 

## 2014-05-24 NOTE — Discharge Instructions (Signed)

## 2014-05-25 LAB — CULTURE, GROUP A STREP

## 2015-01-10 ENCOUNTER — Encounter (HOSPITAL_BASED_OUTPATIENT_CLINIC_OR_DEPARTMENT_OTHER): Payer: Self-pay

## 2015-01-10 ENCOUNTER — Emergency Department (HOSPITAL_BASED_OUTPATIENT_CLINIC_OR_DEPARTMENT_OTHER)
Admission: EM | Admit: 2015-01-10 | Discharge: 2015-01-10 | Disposition: A | Discharge status: Home / Self Care | Payer: Managed Care, Other (non HMO) | Attending: Emergency Medicine | Admitting: Emergency Medicine

## 2015-01-10 DIAGNOSIS — Z720 Tobacco use: Secondary | ICD-10-CM | POA: Diagnosis not present

## 2015-01-10 DIAGNOSIS — M791 Myalgia: Secondary | ICD-10-CM | POA: Diagnosis present

## 2015-01-10 DIAGNOSIS — J029 Acute pharyngitis, unspecified: Secondary | ICD-10-CM | POA: Insufficient documentation

## 2015-01-10 LAB — RAPID STREP SCREEN (MED CTR MEBANE ONLY): Streptococcus, Group A Screen (Direct): NEGATIVE

## 2015-01-10 MED ORDER — KETOROLAC TROMETHAMINE 60 MG/2ML IM SOLN
60.0000 mg | Freq: Once | INTRAMUSCULAR | Status: AC
  Administered 2015-01-10: 60 mg via INTRAMUSCULAR
  Filled 2015-01-10: qty 2

## 2015-01-10 NOTE — ED Notes (Signed)
PA at bedside.

## 2015-01-10 NOTE — ED Notes (Signed)
C/o body aches and sore throat x today

## 2015-01-10 NOTE — ED Provider Notes (Signed)
CSN: [id number]     Arrival date & time 01/10/15  1149 History   First MD Initiated Contact with Patient 01/10/15 1207     Chief Complaint  Patient presents with  . Generalized Body Aches     (Consider location/radiation/quality/duration/timing/severity/associated sxs/prior Treatment) HPI Comments: Pt is a 24 yo male who presents to the ED with complaint of sore throat, onset this morning. Pt reports he woke up this morning with a sore throat and body aches and notes he took 2 ibuprofen with no relief. He states he has been unable to drink or eat anything due to sore throat. Endorses dry cough. Denies fever, rhinorrhea, ear pain, nasal congestion, SOB, CP, abdominal pain, N/V/D/C, urinary sxs, weakness. Denies any sick contacts.    History reviewed. No pertinent past medical history. Past Surgical History  Procedure Laterality Date  . Wrist surgery      right   No family history on file. Social History  Substance Use Topics  . Smoking status: Current Every Day Smoker -- 0.50 packs/day    Types: Cigarettes  . Smokeless tobacco: None  . Alcohol Use: Yes     Comment: occasional    Review of Systems  HENT: Positive for sore throat.   Respiratory: Positive for cough.   Musculoskeletal: Positive for myalgias.  All other systems reviewed and are negative.     Allergies  Review of patient's allergies indicates no known allergies.  Home Medications   Prior to Admission medications   Not on File   BP 127/74 mmHg  Pulse 78  Temp(Src) 99.3 F (37.4 C) (Oral)  Resp 16  Ht  (1.905 m)  Wt 300 lb (136.079 kg)  BMI 37.50 kg/m2  SpO2 99% Physical Exam  Constitutional: He is oriented to person, place, and time. He appears well-developed and well-nourished.  HENT:  Head: Normocephalic and atraumatic.  Right Ear: Tympanic membrane and external ear normal.  Left Ear: External ear normal.  Nose: Nose normal. No rhinorrhea or sinus tenderness. Right sinus exhibits no  maxillary sinus tenderness and no frontal sinus tenderness. Left sinus exhibits no maxillary sinus tenderness and no frontal sinus tenderness.  Mouth/Throat: Uvula is midline, oropharynx is clear and moist and mucous membranes are normal. No oropharyngeal exudate or tonsillar abscesses.  Mild erythema noted on right TM. Swelling and erythema of tonsils, no tonsillar exudate,   Eyes: Conjunctivae and EOM are normal. Right eye exhibits no discharge. Left eye exhibits no discharge. No scleral icterus.  Neck: Normal range of motion. Neck supple.  Lymphadenopathy at submandibular glands.  Cardiovascular: Normal rate, regular rhythm, normal heart sounds and intact distal pulses.   Pulmonary/Chest: Effort normal and breath sounds normal. He has no wheezes. He has no rales. He exhibits no tenderness.  Abdominal: Soft. Bowel sounds are normal. There is no tenderness. There is no rebound and no guarding.  Musculoskeletal: Normal range of motion. He exhibits no edema.  Lymphadenopathy:    He has cervical adenopathy.  Neurological: He is alert and oriented to person, place, and time.  Skin: Skin is warm and dry.    ED Course  Procedures (including critical care time) Labs Review Labs Reviewed  RAPID STREP SCREEN (NOT AT [facility])  CULTURE, GROUP A STREP    Imaging Review No results found. I have personally reviewed and evaluated these images and lab results as part of my medical decision-making.  Filed Vitals:   01/10/15 1158  BP: 127/74  Pulse: 78  Temp: 99.3 F (37.4  C)  Resp: 16   Meds given in ED:  Medications  ketorolac (TORADOL) injection 60 mg (60 mg Intramuscular Given 01/10/15 1241)    New Prescriptions   No medications on file     MDM   Final diagnoses:  Viral pharyngitis    Pt presents with sore throat and myalgias. Vitals stable, no fever. Strep negative. Pt's symptoms likely due to viral pharyngitis.  Pt given IM toradol for pain control.  Discussed results and  plan for d/c with pt. Pt advised to continue taking ibuprofen as prescribed for pain relief and to continue drinking fluids to prevent dehydration. Pt advised to return to the ED if sxs worsen. Pt agrees with plan.     Satira Sark [place], [place] 01/10/15 1307  Gerhard Munch, MD 01/10/15 [phone]

## 2015-01-10 NOTE — Discharge Instructions (Signed)
Please take ibuprofen as prescribed for pain relief and continue to drink fluids.  Please return to the ED if symptoms worsen.

## 2015-01-13 LAB — CULTURE, GROUP A STREP

## 2015-07-08 SURGERY — APPENDECTOMY, LAPAROSCOPIC
Anesthesia: General

## 2015-09-17 ENCOUNTER — Encounter (HOSPITAL_BASED_OUTPATIENT_CLINIC_OR_DEPARTMENT_OTHER): Payer: Self-pay | Admitting: *Deleted

## 2015-09-17 ENCOUNTER — Emergency Department (HOSPITAL_BASED_OUTPATIENT_CLINIC_OR_DEPARTMENT_OTHER)
Admission: EM | Admit: 2015-09-17 | Discharge: 2015-09-17 | Disposition: A | Discharge status: Home / Self Care | Payer: No Typology Code available for payment source | Attending: Emergency Medicine | Admitting: Emergency Medicine

## 2015-09-17 DIAGNOSIS — R519 Headache, unspecified: Secondary | ICD-10-CM

## 2015-09-17 DIAGNOSIS — R51 Headache: Secondary | ICD-10-CM | POA: Insufficient documentation

## 2015-09-17 DIAGNOSIS — F17210 Nicotine dependence, cigarettes, uncomplicated: Secondary | ICD-10-CM | POA: Insufficient documentation

## 2015-09-17 MED ORDER — METOCLOPRAMIDE HCL 10 MG PO TABS
10.0000 mg | ORAL_TABLET | Freq: Once | ORAL | Status: AC
  Administered 2015-09-17: 10 mg via ORAL
  Filled 2015-09-17: qty 1

## 2015-09-17 MED ORDER — DIPHENHYDRAMINE HCL 25 MG PO CAPS
50.0000 mg | ORAL_CAPSULE | Freq: Once | ORAL | Status: AC
  Administered 2015-09-17: 50 mg via ORAL
  Filled 2015-09-17: qty 2

## 2015-09-17 MED ORDER — KETOROLAC TROMETHAMINE 30 MG/ML IJ SOLN
30.0000 mg | Freq: Once | INTRAMUSCULAR | Status: AC
  Administered 2015-09-17: 30 mg via INTRAMUSCULAR
  Filled 2015-09-17: qty 1

## 2015-09-17 NOTE — Discharge Instructions (Signed)
Please follow-up with your doctor for further evaluation and management of your symptoms. Return to ED for any new or worsening symptoms as we discussed °

## 2015-09-17 NOTE — ED Provider Notes (Signed)
CSN: [id number]     Arrival date & time 09/17/15  1444 History  By signing my name below, I, Dustin Woods, attest that this documentation has been prepared under the direction and in the presence of [employer]. Electronically Signed: [name], ED Scribe. 09/17/2015 4:11 PM   Chief Complaint  Patient presents with  . Headache    The history is provided by the patient. No language interpreter was used.   Dustin Woods is a 25 y.o. male who presents to the Emergency Department complaining of a headache with onset 3-4 days ago. Advil and BC powder have provided insufficient pain relief at home. The pain is similar to migraines that he has had in the past but is lasting longer. Movement exacerbates the pain.  Associated symptoms include photophobia, myalgias, and night sweats. Pt denies phonophobia, nausea, vomiting, visual disturbance, numbness, tingling, weakness, fever, and rash. He is otherwise healthy and on no daily medication. NKDA.   History reviewed. No pertinent past medical history. Past Surgical History  Procedure Laterality Date  . Wrist surgery      right   No family history on file. Social History  Substance Use Topics  . Smoking status: Current Every Day Smoker -- 0.50 packs/day    Types: Cigarettes  . Smokeless tobacco: Never Used  . Alcohol Use: Yes     Comment: occasional    Review of Systems 10 Systems reviewed and all are negative for acute change except as noted in the HPI.   Allergies  Review of patient's allergies indicates no known allergies.  Home Medications   Prior to Admission medications   Not on File   BP 123/91 mmHg  Pulse 62  Temp(Src) 98.8 F (37.1 C) (Oral)  Resp 20  Ht  (1.905 m)  Wt 320 lb (145.151 kg)  BMI 40.00 kg/m2  SpO2 100% Physical Exam  Constitutional: He is oriented to person, place, and time. He appears well-developed and well-nourished. No distress.  HENT:  Head: Normocephalic and atraumatic.   Mouth/Throat: Oropharynx is clear and moist.  Eyes: Conjunctivae and EOM are normal. Pupils are equal, round, and reactive to light.  Neck: Normal range of motion. Neck supple. No tracheal deviation present.  No meningismus or nuchal rigidity  Cardiovascular: Normal rate, regular rhythm and normal heart sounds.   Pulmonary/Chest: Effort normal and breath sounds normal. No respiratory distress.  Abdominal: Soft. There is no tenderness.  Musculoskeletal: Normal range of motion. He exhibits no edema or tenderness.  Neurological: He is alert and oriented to person, place, and time.  Cranial nerves III through XII grossly intact. Motor strength is 5/5 in all 4 extremities and sensation is intact to light touch. Completes finger to nose coordination movements without difficulty. No nystagmus. Gait is baseline without ataxia.  Skin: Skin is warm and dry. He is not diaphoretic.  Psychiatric: He has a normal mood and affect. His behavior is normal.  Nursing note and vitals reviewed.   ED Course  Procedures (including critical care time) DIAGNOSTIC STUDIES: Oxygen Saturation is 100% on RA,  normal by my interpretation.    COORDINATION OF CARE: 4:10 PM Discussed treatment plan which includes migraine cocktail with pt at bedside and pt agreed to plan.  Labs Review Labs Reviewed - No data to display  Imaging Review No results found.    EKG Interpretation None     Meds given in ED:  Medications  ketorolac (TORADOL) 30 MG/ML injection 30 mg (30 mg Intramuscular Given  09/17/15 1631)  metoCLOPramide (REGLAN) tablet 10 mg (10 mg Oral Given 09/17/15 1632)  diphenhydrAMINE (BENADRYL) capsule 50 mg (50 mg Oral Given 09/17/15 1631)    New Prescriptions   No medications on file   Filed Vitals:   09/17/15 1453  BP: 123/91  Pulse: 62  Temp: 98.8 F (37.1 C)  TempSrc: Oral  Resp: 20  Height: 6\' 3"  (1.905 m)  Weight: 145.151 kg  SpO2: 100%    MDM  Pt HA treated and improved while in  ED.  Presentation is like pts typical HA and non concerning for [facility], ICH, Meningitis, or temporal arteritis. Pt is afebrile with no focal neuro deficits, nuchal rigidity, or change in vision. Pt is to follow up with PCP to discuss prophylactic medication. Pt verbalizes understanding and is agreeable with plan to dc.   Final diagnoses:  Nonintractable headache, unspecified chronicity pattern, unspecified headache type  I personally performed the services described in this documentation, which was scribed in my presence. The recorded information has been reviewed and is accurate.    Joycie PeekBenjamin Rosezetta Balderston, PA-C 09/17/15 2002  Tilden FossaElizabeth Rees, MD 09/18/15 1500

## 2015-09-17 NOTE — ED Notes (Signed)
Reports migraine x 3-4 days. Taking OTC meds without relief. Denies n/v. Reports light sensitivity and feeling light headed

## 2015-09-18 ENCOUNTER — Encounter (HOSPITAL_BASED_OUTPATIENT_CLINIC_OR_DEPARTMENT_OTHER): Payer: Self-pay | Admitting: *Deleted

## 2015-09-18 ENCOUNTER — Emergency Department (HOSPITAL_BASED_OUTPATIENT_CLINIC_OR_DEPARTMENT_OTHER)
Admission: EM | Admit: 2015-09-18 | Discharge: 2015-09-18 | Disposition: A | Discharge status: Home / Self Care | Payer: No Typology Code available for payment source | Attending: Emergency Medicine | Admitting: Emergency Medicine

## 2015-09-18 DIAGNOSIS — F17210 Nicotine dependence, cigarettes, uncomplicated: Secondary | ICD-10-CM | POA: Insufficient documentation

## 2015-09-18 DIAGNOSIS — R519 Headache, unspecified: Secondary | ICD-10-CM

## 2015-09-18 DIAGNOSIS — R51 Headache: Secondary | ICD-10-CM | POA: Insufficient documentation

## 2015-09-18 MED ORDER — DIPHENHYDRAMINE HCL 50 MG/ML IJ SOLN
25.0000 mg | Freq: Once | INTRAMUSCULAR | Status: AC
  Administered 2015-09-18: 25 mg via INTRAVENOUS
  Filled 2015-09-18: qty 1

## 2015-09-18 MED ORDER — BUTALBITAL-APAP-CAFFEINE 50-325-40 MG PO TABS: 1.0000 | ORAL_TABLET | Freq: Four times a day (QID) | ORAL | Status: AC | PRN

## 2015-09-18 MED ORDER — KETOROLAC TROMETHAMINE 30 MG/ML IJ SOLN
30.0000 mg | Freq: Once | INTRAMUSCULAR | Status: AC
  Administered 2015-09-18: 30 mg via INTRAVENOUS
  Filled 2015-09-18: qty 1

## 2015-09-18 MED ORDER — SODIUM CHLORIDE 0.9 % IV BOLUS (SEPSIS)
1000.0000 mL | Freq: Once | INTRAVENOUS | Status: AC
  Administered 2015-09-18: 1000 mL via INTRAVENOUS

## 2015-09-18 MED ORDER — METOCLOPRAMIDE HCL 5 MG/ML IJ SOLN
10.0000 mg | Freq: Once | INTRAMUSCULAR | Status: AC
Start: 2015-09-18 — End: 2015-09-18
  Administered 2015-09-18: 10 mg via INTRAVENOUS
  Filled 2015-09-18: qty 2

## 2015-09-18 MED ORDER — MAGNESIUM SULFATE 2 GM/50ML IV SOLN: 2.0000 g | Freq: Once | INTRAVENOUS | Status: DC

## 2015-09-18 NOTE — ED Notes (Signed)
Rx x 1 given for fioricet. D/c home with ride

## 2015-09-18 NOTE — ED Provider Notes (Signed)
CSN: [id number]     Arrival date & time 09/18/15  1434 History  By signing my name below, I, [name], attest that this documentation has been prepared under the direction and in the presence of [name], MD. Electronically Signed: [name], ED Scribe. 09/18/2015. 5:14 PM.   Chief Complaint  Patient presents with  . Migraine   The history is provided by the patient. No language interpreter was used.  HPI Comments: Dustin Woods is a 25 y.o. male who presents to the Emergency Department complaining of constant, unchanging, sharp, moderate HA to his entire head onset 5 days ago upon waking. Patient states that he was seen yesterday for the same complaint where he received treatment in the ED which improved symptoms before they returned to baseline that woke him from sleep this morning. He reports photophobia and intermittent neck pain when lying down. HA worse with cough and bending over. He has taken BC, Tylenol, ibuprofen without relief. Per pt, he has migraines twice a month that normally resolve in one day with OTC medications but states that current HA is greater in severity and lasting longer despite treatment. However it feels like a headache that's typical for him but longer in duration. He drank 2 shots of alcohol yesterday and took marijuana. Denies seeing a neurologist in the past for these symptoms. Denies taking cocaine or heroin. Denies blurred vision, dizziness, fevers, neck stiffness, numbness, and weakness.  History reviewed. No pertinent past medical history. Past Surgical History  Procedure Laterality Date  . Wrist surgery      right   No family history on file. Social History  Substance Use Topics  . Smoking status: Current Every Day Smoker -- 0.50 packs/day    Types: Cigarettes  . Smokeless tobacco: Never Used  . Alcohol Use: Yes     Comment: occasional    Review of Systems  Constitutional: Negative for fever.  Eyes: Positive for  photophobia. Negative for visual disturbance.  Musculoskeletal: Positive for neck pain. Negative for neck stiffness.  Neurological: Positive for headaches. Negative for dizziness, weakness and numbness.  All other systems reviewed and are negative.   Allergies  Review of patient's allergies indicates no known allergies.  Home Medications   Prior to Admission medications   Medication Sig Start Date End Date Taking? Authorizing Provider  butalbital-acetaminophen-caffeine (FIORICET) 50-325-40 MG tablet Take 1-2 tablets by mouth every 6 (six) hours as needed for headache. 09/18/15 09/17/16  [name], MD   BP 114/58 mmHg  Pulse 50  Temp(Src) 99.5 F (37.5 C) (Oral)  Resp 24  Ht  (1.905 m)  Wt 320 lb (145.151 kg)  BMI 40.00 kg/m2  SpO2 99% Physical Exam  Constitutional: He is oriented to person, place, and time. He appears well-developed and well-nourished.  HENT:  Head: Normocephalic and atraumatic.  Right Ear: External ear normal.  Left Ear: External ear normal.  Nose: Nose normal.  Eyes: EOM are normal. Pupils are equal, round, and reactive to light. Right eye exhibits no discharge. Left eye exhibits no discharge.  Photophobia  Neck: Neck supple.  Normal passive ROM without stiffness or pain.  Cardiovascular: Normal rate, regular rhythm, normal heart sounds and intact distal pulses.   Pulmonary/Chest: Effort normal and breath sounds normal.  Abdominal: Soft. There is no tenderness.  Musculoskeletal: He exhibits no edema.  Neurological: He is alert and oriented to person, place, and time.  Cranial nerves II-XII grossly intact. 5/5 strength in all four extremities.  Skin: Skin  is warm and dry.  Nursing note and vitals reviewed.   ED Course  Procedures   DIAGNOSTIC STUDIES: Oxygen Saturation is 99% on RA, normal by my interpretation.    COORDINATION OF CARE: 3:55 PM Will order Reglan, Benadryl, Toradol, and IV fluids. Discussed treatment plan with pt at bedside  and pt agreed to plan.  5:12 PM Spoke with nurse who stated pt has not improved with ED treatment. Ordered magnesium sulfate. Will prescribe Fioricet. Advised pt to follow-up with PCP. Returned to inform pt of treatment plan.  MDM   Final diagnoses:  Bad headache    Patient with a migraine that is more severe than typical but otherwise feels just like his migraines that he gets multiple times per month. Afebrile. No meningismus. Neuro exam benign. I have low suspicion for acute, emergent CNS pathology. Feels much better after treatment here and wants to go home. Discussed return precautions. At this time I have encouraged f/u with neuro for chronic headaches.   I personally performed the services described in this documentation, which was scribed in my presence. The recorded information has been reviewed and is accurate.    Pricilla LovelessScott Darlys Buis, MD 09/19/15 [phone]

## 2015-09-18 NOTE — ED Notes (Signed)
Pt states migraine headaches x 5 days. Reports being seen here for the same yesterday, had relief "for a little while" then last night the headache became worse. Took ibuprofen earlier this morning without relief.

## 2016-04-28 ENCOUNTER — Emergency Department (HOSPITAL_BASED_OUTPATIENT_CLINIC_OR_DEPARTMENT_OTHER)
Admission: EM | Admit: 2016-04-28 | Discharge: 2016-04-28 | Disposition: A | Discharge status: Home / Self Care | Payer: Managed Care, Other (non HMO) | Attending: Emergency Medicine | Admitting: Emergency Medicine

## 2016-04-28 ENCOUNTER — Encounter (HOSPITAL_BASED_OUTPATIENT_CLINIC_OR_DEPARTMENT_OTHER): Payer: Self-pay | Admitting: Emergency Medicine

## 2016-04-28 DIAGNOSIS — F17210 Nicotine dependence, cigarettes, uncomplicated: Secondary | ICD-10-CM | POA: Insufficient documentation

## 2016-04-28 DIAGNOSIS — M5410 Radiculopathy, site unspecified: Secondary | ICD-10-CM

## 2016-04-28 DIAGNOSIS — Z79899 Other long term (current) drug therapy: Secondary | ICD-10-CM | POA: Insufficient documentation

## 2016-04-28 DIAGNOSIS — M5416 Radiculopathy, lumbar region: Secondary | ICD-10-CM | POA: Insufficient documentation

## 2016-04-28 MED ORDER — OXYCODONE-ACETAMINOPHEN 5-325 MG PO TABS: 1.0000 | ORAL_TABLET | Freq: Four times a day (QID) | ORAL | 0 refills | Status: AC | PRN

## 2016-04-28 MED ORDER — NAPROXEN 500 MG PO TABS: 500.0000 mg | ORAL_TABLET | Freq: Two times a day (BID) | ORAL | 0 refills | Status: AC

## 2016-04-28 MED ORDER — OXYCODONE-ACETAMINOPHEN 5-325 MG PO TABS
2.0000 | ORAL_TABLET | Freq: Once | ORAL | Status: AC
  Administered 2016-04-28: 2 via ORAL
  Filled 2016-04-28: qty 2

## 2016-04-28 MED ORDER — KETOROLAC TROMETHAMINE 60 MG/2ML IM SOLN
60.0000 mg | Freq: Once | INTRAMUSCULAR | Status: AC
  Administered 2016-04-28: 60 mg via INTRAMUSCULAR
  Filled 2016-04-28: qty 2

## 2016-04-28 NOTE — ED Provider Notes (Signed)
[facility] Provider Note   CSN: [id number] Arrival date & time: 04/28/16  1141     History   Chief Complaint Chief Complaint  Patient presents with  . Back Pain    HPI Dustin Woods is a 25 y.o. male.  Patient is a 25 year old male with no significant past medical history. He presents for evaluation of low back pain. This started acutely yesterday evening as he was standing up from a sitting position. He reports sudden onset of severe pain in his lumbar region radiating down his left leg. He denies any weakness, numbness, or bowel or bladder complaints. His pain is worse when he attempts to be late and move and relieved with rest. He has no prior history of back problems.   The history is provided by the patient.  Back Pain   This is a new problem. The current episode started yesterday. The problem occurs constantly. The problem has been rapidly worsening. The pain is associated with no known injury. The pain is severe. The symptoms are aggravated by bending, twisting and certain positions. The pain is the same all the time. Pertinent negatives include no numbness, no bowel incontinence, no bladder incontinence, no paresthesias, no paresis, no tingling and no weakness. He has tried nothing for the symptoms.    History reviewed. No pertinent past medical history.  There are no active problems to display for this patient.   Past Surgical History:  Procedure Laterality Date  . WRIST SURGERY     right       Home Medications    Prior to Admission medications   Medication Sig Start Date End Date Taking? Authorizing Provider  butalbital-acetaminophen-caffeine (FIORICET) 50-325-40 MG tablet Take 1-2 tablets by mouth every 6 (six) hours as needed for headache. 09/18/15 09/17/16  Pricilla LovelessScott Goldston, MD    Family History No family history on file.  Social History Social History  Substance Use Topics  . Smoking status: Current Every Day Smoker    Packs/day: 0.50   Types: Cigarettes  . Smokeless tobacco: Never Used  . Alcohol use Yes     Comment: occasional     Allergies   Patient has no known allergies.   Review of Systems Review of Systems  Gastrointestinal: Negative for bowel incontinence.  Genitourinary: Negative for bladder incontinence.  Musculoskeletal: Positive for back pain.  Neurological: Negative for tingling, weakness, numbness and paresthesias.     Physical Exam Updated Vital Signs BP 141/83 (BP Location: Right Arm)   Pulse 60   Temp 98.8 F (37.1 C) (Oral)   Resp 20   Ht 6\' 3"  (1.905 m)   Wt (!) 320 lb (145.2 kg)   SpO2 100%   BMI 40.00 kg/m   Physical Exam  Constitutional: He is oriented to person, place, and time. He appears well-developed and well-nourished. No distress.  HENT:  Head: Normocephalic and atraumatic.  Mouth/Throat: Oropharynx is clear and moist.  Neck: Normal range of motion. Neck supple.  Cardiovascular: Normal rate and regular rhythm.  Exam reveals no friction rub.   No murmur heard. Pulmonary/Chest: Effort normal and breath sounds normal. No respiratory distress. He has no wheezes. He has no rales.  Abdominal: Soft. Bowel sounds are normal. He exhibits no distension. There is no tenderness.  Musculoskeletal: Normal range of motion. He exhibits no edema.  There is tenderness to palpation in the lumbar soft tissues, however no bony tenderness or step-off.  Neurological: He is alert and oriented to person, place, and time. Coordination  normal.  Strength is 5 out of 5 in both lower extremities. DTRs are 1+ and symmetrical in the Achilles and patellar tendons bilaterally. He is able to ambulate, however with some discomfort.  Skin: Skin is warm and dry. He is not diaphoretic.  Nursing note and vitals reviewed.    ED Treatments / Results  Labs (all labs ordered are listed, but only abnormal results are displayed) Labs Reviewed - No data to display  EKG  EKG Interpretation None        Radiology No results found.  Procedures Procedures (including critical care time)  Medications Ordered in ED Medications  ketorolac (TORADOL) injection 60 mg (not administered)  oxyCODONE-acetaminophen (PERCOCET/ROXICET) 5-325 MG per tablet 2 tablet (not administered)     Initial Impression / Assessment and Plan / ED Course  I have reviewed the triage vital signs and the nursing notes.  Pertinent labs & imaging results that were available during my care of the patient were reviewed by me and considered in my medical decision making (see chart for details).  Clinical Course     Patient presents with complaints of acute radicular low back pain. He has symmetrical strength and reflexes and no bowel or bladder complaints that would be suggestive of an emergent condition. He will be treated with anti-inflammatories, pain medication, and when necessary follow-up with his primary Dr. if not improving.  Final Clinical Impressions(s) / ED Diagnoses   Final diagnoses:  None    New Prescriptions New Prescriptions   No medications on file     Geoffery Lyonsouglas Jarika Robben, MD 04/28/16 1257

## 2016-04-28 NOTE — ED Triage Notes (Signed)
Low back pain since yesterday, denies injury

## 2016-04-28 NOTE — ED Notes (Signed)
Reports taking 2 Extra Strength Tylenol approx 2 hrs ago with no relief.

## 2016-04-28 NOTE — Discharge Instructions (Signed)
Naproxen as prescribed.  Percocet as prescribed as needed for pain not relieved with naproxen.  Follow-up with your primary Dr. if not improving in the next 3-4 days, and return to the ER symptoms significantly worsen or change.

## 2018-08-03 ENCOUNTER — Emergency Department (HOSPITAL_BASED_OUTPATIENT_CLINIC_OR_DEPARTMENT_OTHER): Payer: Self-pay

## 2018-08-03 ENCOUNTER — Other Ambulatory Visit: Payer: Self-pay

## 2018-08-03 ENCOUNTER — Encounter (HOSPITAL_BASED_OUTPATIENT_CLINIC_OR_DEPARTMENT_OTHER): Payer: Self-pay | Admitting: *Deleted

## 2018-08-03 ENCOUNTER — Emergency Department (HOSPITAL_BASED_OUTPATIENT_CLINIC_OR_DEPARTMENT_OTHER)
Admission: EM | Admit: 2018-08-03 | Discharge: 2018-08-04 | Disposition: A | Discharge status: Home / Self Care | Payer: Self-pay | Attending: Emergency Medicine | Admitting: Emergency Medicine

## 2018-08-03 DIAGNOSIS — R55 Syncope and collapse: Secondary | ICD-10-CM | POA: Insufficient documentation

## 2018-08-03 DIAGNOSIS — F17210 Nicotine dependence, cigarettes, uncomplicated: Secondary | ICD-10-CM | POA: Insufficient documentation

## 2018-08-03 DIAGNOSIS — Z79899 Other long term (current) drug therapy: Secondary | ICD-10-CM | POA: Insufficient documentation

## 2018-08-03 DIAGNOSIS — F1910 Other psychoactive substance abuse, uncomplicated: Secondary | ICD-10-CM | POA: Insufficient documentation

## 2018-08-03 LAB — BASIC METABOLIC PANEL
Anion gap: 6 (ref 5–15)
BUN: 9 mg/dL (ref 6–20)
CHLORIDE: 103 mmol/L (ref 98–111)
CO2: 30 mmol/L (ref 22–32)
CREATININE: 0.95 mg/dL (ref 0.61–1.24)
Calcium: 9.2 mg/dL (ref 8.9–10.3)
GFR calc Af Amer: >60 mL/min (ref >60)
GFR calc non Af Amer: >60 mL/min (ref >60)
Glucose, Bld: 98 mg/dL (ref 70–99)
POTASSIUM: 3.7 mmol/L (ref 3.5–5.1)
Sodium: 139 mmol/L (ref 135–145)

## 2018-08-03 LAB — CBC
HEMATOCRIT: 42 % (ref 39.0–52.0)
Hemoglobin: 13.2 g/dL (ref 13.0–17.0)
MCH: 26.7 pg (ref 26.0–34.0)
MCHC: 31.4 g/dL (ref 30.0–36.0)
MCV: 85 fL (ref 80.0–100.0)
Platelets: 174 10*3/uL (ref 150–400)
RBC: 4.94 MIL/uL (ref 4.22–5.81)
RDW: 12.9 % (ref 11.5–15.5)
WBC: 9.2 10*3/uL (ref 4.0–10.5)
nRBC: 0 % (ref 0.0–0.2)

## 2018-08-03 LAB — RAPID URINE DRUG SCREEN, HOSP PERFORMED
Amphetamines: POSITIVE — AB
BARBITURATES: NOT DETECTED
Benzodiazepines: NOT DETECTED
Cocaine: NOT DETECTED
Opiates: POSITIVE — AB
Tetrahydrocannabinol: POSITIVE — AB

## 2018-08-03 LAB — HEPATIC FUNCTION PANEL
ALT: 18 U/L (ref 0–44)
AST: 35 U/L (ref 15–41)
Albumin: 4.1 g/dL (ref 3.5–5.0)
Alkaline Phosphatase: 40 U/L (ref 38–126)
Bilirubin, Direct: 0.2 mg/dL (ref 0.0–0.2)
Indirect Bilirubin: 0.6 mg/dL (ref 0.3–0.9)
Total Bilirubin: 0.8 mg/dL (ref 0.3–1.2)
Total Protein: 6.6 g/dL (ref 6.5–8.1)

## 2018-08-03 LAB — CBG MONITORING, ED: Glucose-Capillary: 103 mg/dL — ABNORMAL HIGH (ref 70–99)

## 2018-08-03 NOTE — ED Triage Notes (Signed)
Pt reports he has been having "blackouts" x 2 weeks. Pt reports they happen when he is standing and then he "wakes up on the floor". Pt has swelling to his lower lip which he thinks he may have injured during a episode of passing out. Pt had an episode today where his gf found him on the kitchen floor

## 2018-08-03 NOTE — ED Provider Notes (Signed)
[facility] EMERGENCY DEPARTMENT Provider Note   CSN: [id number] Arrival date & time: 08/03/18  2203    History   Chief Complaint Chief Complaint  Patient presents with  . Loss of Consciousness    HPI Dustin Woods is a 28 y.o. male.     The history is provided by the patient.  Near Syncope  This is a recurrent problem. The current episode started more than 1 week ago. The problem occurs rarely. The problem has not changed since onset.Pertinent negatives include no chest pain, no abdominal pain, no headaches and no shortness of breath. Nothing aggravates the symptoms. Nothing relieves the symptoms. He has tried nothing for the symptoms. The treatment provided no relief.  Feels himself about the pass out and as he starts going down he feels better.  No CP, no SOB, no DOE. No cough, no sore throat.  No n/v/d.  No travel no leg pain or swelling.  No long car trips or plane trips.  No HA,  No changes in vision or speech.  No weakness.    History reviewed. No pertinent past medical history.  There are no active problems to display for this patient.   Past Surgical History:  Procedure Laterality Date  . WRIST SURGERY     right        Home Medications    Prior to Admission medications   Medication Sig Start Date End Date Taking? Authorizing Provider  naproxen (NAPROSYN) 500 MG tablet Take 1 tablet (500 mg total) by mouth 2 (two) times daily. 04/28/16   Geoffery Lyons, MD  oxyCODONE-acetaminophen (PERCOCET) 5-325 MG tablet Take 1-2 tablets by mouth every 6 (six) hours as needed. 04/28/16   Geoffery Lyons, MD    Family History No family history on file.  Social History Social History   Tobacco Use  . Smoking status: Current Every Day Smoker    Packs/day: 0.50    Types: Cigarettes  . Smokeless tobacco: Never Used  Substance Use Topics  . Alcohol use: Yes    Comment: occasional  . Drug use: Yes    Types: Marijuana    Comment: rare     Allergies   Patient  has no known allergies.   Review of Systems Review of Systems  Constitutional: Negative for diaphoresis, fatigue and fever.  HENT: Negative for sore throat and voice change.   Eyes: Negative for photophobia and visual disturbance.  Respiratory: Negative for chest tightness and shortness of breath.   Cardiovascular: Positive for near-syncope. Negative for chest pain, palpitations and leg swelling.  Gastrointestinal: Negative for abdominal pain, diarrhea, nausea and vomiting.  Genitourinary: Negative for dysuria.  Musculoskeletal: Negative for back pain, gait problem and neck pain.  Neurological: Negative for dizziness, tremors, seizures, facial asymmetry, speech difficulty, weakness, light-headedness, numbness and headaches.  Psychiatric/Behavioral: Negative for self-injury and suicidal ideas.  All other systems reviewed and are negative.    Physical Exam Updated Vital Signs BP 120/64 (BP Location: Right Arm)   Pulse (!) 58   Temp 98.4 F (36.9 C) (Oral)   Resp 18   Ht 6\' 4"  (1.93 m)   Wt 127 kg   SpO2 96%   BMI 34.08 kg/m   Physical Exam Vitals signs reviewed.  Constitutional:      Appearance: He is obese. He is not ill-appearing.  HENT:     Head: Normocephalic and atraumatic.     Nose: Nose normal.     Mouth/Throat:     Mouth: Mucous membranes  are moist.     Pharynx: Oropharynx is clear.  Eyes:     Extraocular Movements: Extraocular movements intact.     Conjunctiva/sclera: Conjunctivae normal.     Pupils: Pupils are equal, round, and reactive to light.  Neck:     Musculoskeletal: Normal range of motion and neck supple. No neck rigidity.  Cardiovascular:     Rate and Rhythm: Normal rate and regular rhythm.     Pulses: Normal pulses.     Heart sounds: Normal heart sounds.  Pulmonary:     Effort: Pulmonary effort is normal.     Breath sounds: Normal breath sounds.  Abdominal:     General: Abdomen is flat. Bowel sounds are normal.     Tenderness: There is no  abdominal tenderness. There is no guarding or rebound.  Musculoskeletal: Normal range of motion.        General: No tenderness.     Right lower leg: No edema.     Left lower leg: No edema.  Lymphadenopathy:     Cervical: No cervical adenopathy.  Skin:    General: Skin is warm and dry.     Capillary Refill: Capillary refill takes less than 2 seconds.     Findings: No bruising.  Neurological:     General: No focal deficit present.     Mental Status: He is alert and oriented to person, place, and time.     Cranial Nerves: No cranial nerve deficit.     Deep Tendon Reflexes: Reflexes normal.  Psychiatric:        Mood and Affect: Mood normal.      ED Treatments / Results  Labs (all labs ordered are listed, but only abnormal results are displayed) Results for orders placed or performed during the hospital encounter of 08/03/18  Basic metabolic panel  Result Value Ref Range   Sodium 139 135 - 145 mmol/L   Potassium 3.7 3.5 - 5.1 mmol/L   Chloride 103 98 - 111 mmol/L   CO2 30 22 - 32 mmol/L   Glucose, Bld 98 70 - 99 mg/dL   BUN 9 6 - 20 mg/dL   Creatinine, Ser 1.61 0.61 - 1.24 mg/dL   Calcium 9.2 8.9 - 09.6 mg/dL   GFR calc non Af Amer >60 >60 mL/min   GFR calc Af Amer >60 >60 mL/min   Anion gap 6 5 - 15  CBC  Result Value Ref Range   WBC 9.2 4.0 - 10.5 K/uL   RBC 4.94 4.22 - 5.81 MIL/uL   Hemoglobin 13.2 13.0 - 17.0 g/dL   HCT 04.5 40.9 - 81.1 %   MCV 85.0 80.0 - 100.0 fL   MCH 26.7 26.0 - 34.0 pg   MCHC 31.4 30.0 - 36.0 g/dL   RDW 91.4 78.2 - 95.6 %   Platelets 174 150 - 400 K/uL   nRBC 0.0 0.0 - 0.2 %  Hepatic function panel  Result Value Ref Range   Total Protein 6.6 6.5 - 8.1 g/dL   Albumin 4.1 3.5 - 5.0 g/dL   AST 35 15 - 41 U/L   ALT 18 0 - 44 U/L   Alkaline Phosphatase 40 38 - 126 U/L   Total Bilirubin 0.8 0.3 - 1.2 mg/dL   Bilirubin, Direct 0.2 0.0 - 0.2 mg/dL   Indirect Bilirubin 0.6 0.3 - 0.9 mg/dL  Urine rapid drug screen (hosp performed)  Result Value  Ref Range   Opiates POSITIVE (A) NONE DETECTED   Cocaine NONE DETECTED  NONE DETECTED   Benzodiazepines NONE DETECTED NONE DETECTED   Amphetamines POSITIVE (A) NONE DETECTED   Tetrahydrocannabinol POSITIVE (A) NONE DETECTED   Barbiturates NONE DETECTED NONE DETECTED  CBG monitoring, ED  Result Value Ref Range   Glucose-Capillary 103 (H) 70 - 99 mg/dL   Comment 1 Document in Chart    Dg Chest 2 View  Result Date: 08/03/2018 CLINICAL DATA:  Smoker; no h/o heart or lung problems; pt has been passing out for the past 2 weeks w/no warning; 05/08/2011 EXAM: CHEST - 2 VIEW COMPARISON:  None. FINDINGS: The heart size and mediastinal contours are within normal limits. Both lungs are clear. No pleural effusion or pneumothorax. The visualized skeletal structures are unremarkable. IMPRESSION: Normal chest radiographs. Electronically Signed   By: Amie Portland M.D.   On: 08/03/2018 23:32   Ct Head Wo Contrast  Result Date: 08/03/2018 CLINICAL DATA:  Multiple episodes of syncope over the last 2 weeks. Headache. EXAM: CT HEAD WITHOUT CONTRAST TECHNIQUE: Contiguous axial images were obtained from the base of the skull through the vertex without intravenous contrast. COMPARISON:  None. FINDINGS: Brain: No acute intracranial abnormality. Specifically, no hemorrhage, hydrocephalus, mass lesion, acute infarction, or significant intracranial injury. Vascular: No hyperdense vessel or unexpected calcification. Skull: No acute calvarial abnormality. Sinuses/Orbits: Visualized paranasal sinuses and mastoids clear. Orbital soft tissues unremarkable. Other: None IMPRESSION: Normal study. Electronically Signed   By: Charlett Nose M.D.   On: 08/03/2018 22:59    EKG EKG Interpretation  Date/Time:  Sunday August 03 2018 22:25:30 EDT Ventricular Rate:  64 PR Interval:    QRS Duration: 99 QT Interval:  398 QTC Calculation: 411 R Axis:   -26 Text Interpretation:  Sinus rhythm Confirmed by Petrita Blunck ([id number]) on  08/03/2018 11:11:23 PM   Radiology Dg Chest 2 View  Result Date: 08/03/2018 CLINICAL DATA:  Smoker; no h/o heart or lung problems; pt has been passing out for the past 2 weeks w/no warning; 05/08/2011 EXAM: CHEST - 2 VIEW COMPARISON:  None. FINDINGS: The heart size and mediastinal contours are within normal limits. Both lungs are clear. No pleural effusion or pneumothorax. The visualized skeletal structures are unremarkable. IMPRESSION: Normal chest radiographs. Electronically Signed   By: Amie Portland M.D.   On: 08/03/2018 23:32   Ct Head Wo Contrast  Result Date: 08/03/2018 CLINICAL DATA:  Multiple episodes of syncope over the last 2 weeks. Headache. EXAM: CT HEAD WITHOUT CONTRAST TECHNIQUE: Contiguous axial images were obtained from the base of the skull through the vertex without intravenous contrast. COMPARISON:  None. FINDINGS: Brain: No acute intracranial abnormality. Specifically, no hemorrhage, hydrocephalus, mass lesion, acute infarction, or significant intracranial injury. Vascular: No hyperdense vessel or unexpected calcification. Skull: No acute calvarial abnormality. Sinuses/Orbits: Visualized paranasal sinuses and mastoids clear. Orbital soft tissues unremarkable. Other: None IMPRESSION: Normal study. Electronically Signed   By: Charlett Nose M.D.   On: 08/03/2018 22:59    Procedures Procedures (including critical care time)  Orthostatic VS for the past 24 hrs:  BP- Lying Pulse- Lying BP- Sitting Pulse- Sitting BP- Standing at 0 minutes Pulse- Standing at 0 minutes  08/03/18 2340 120/64 57 114/58 69 127/82 71    Mild orthostasis. Without family in the room EDP had a long cnadid conversation about the substance abuse and how mixing drugs is likely the cause of these episodes and the patient needs to stop this as this is likely the source of these episodes.  Moreover, the patient was advised to follow up  with PMD for holter monitor and not to drive until cleared by his PMD.  Patient  verbalizes understanding and agrees to follow up    Final Clinical Impressions(s) / ED Diagnoses   Return for intractable cough, coughing up blood,fevers >100.4 unrelieved by medication, shortness of breath, intractable vomiting, chest pain, shortness of breath, weakness,numbness, changes in speech, facial asymmetry,abdominal pain, passing out,Inability to tolerate liquids or food, cough, altered mental status or any concerns. No signs of systemic illness or infection. The patient is nontoxic-appearing on exam and vital signs are within normal limits.   I have reviewed the triage vital signs and the nursing notes. Pertinent labs &imaging results that were available during my care of the patient were reviewed by me and considered in my medical decision making (see chart for details).  After history, exam, and medical workup I feel the patient has been appropriately medically screened and is safe for discharge home. Pertinent diagnoses were discussed with the patient. Patient was given return precautions.      Shanielle Correll, MD 08/04/18 [phone]

## 2018-08-04 NOTE — ED Notes (Signed)
ED Provider at bedside. 

## 2018-08-04 NOTE — Discharge Instructions (Addendum)
No driving until cleared by a specialist

## 2019-05-28 IMAGING — CT CT HEAD WITHOUT CONTRAST
3 series · 16 of 47 positions shown, 19 images · non-contrast
Comparison: None.

CLINICAL DATA: Multiple episodes of syncope over the last 2 weeks.
Headache.

EXAM:
CT HEAD WITHOUT CONTRAST
TECHNIQUE: Contiguous axial images were obtained from the base of the skull
through the vertex without intravenous contrast.

[Series 2: head wo · axial · 0.48mm/px · z∈[+606,+752]mm · 10 of 35 slices shown, 13 images]
[im 3/35  brain]
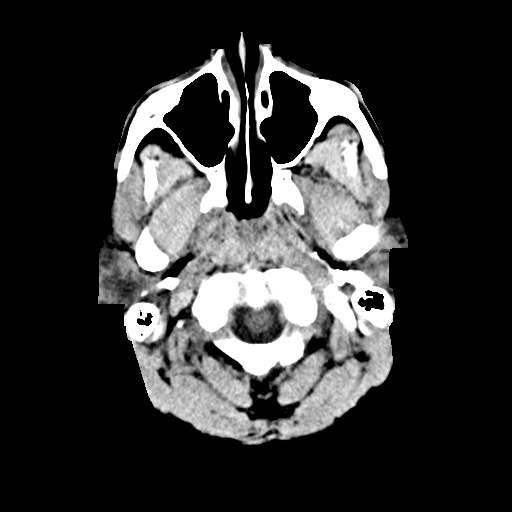
[im 3/35  bone]
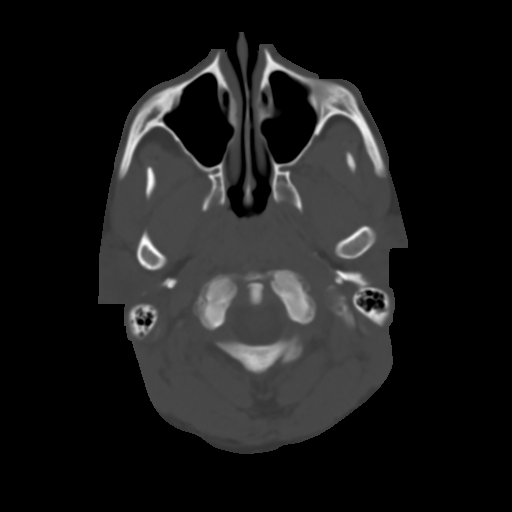
[im 6/35  brain]
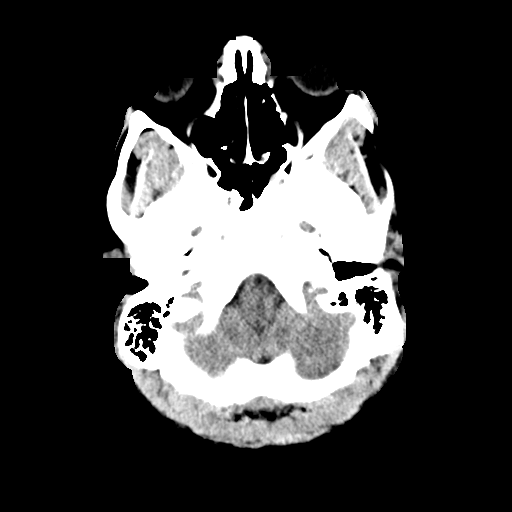
[im 10/35  brain]
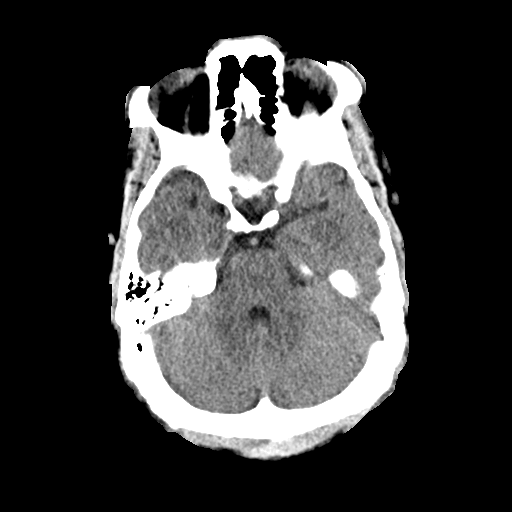
[im 12/35  brain]
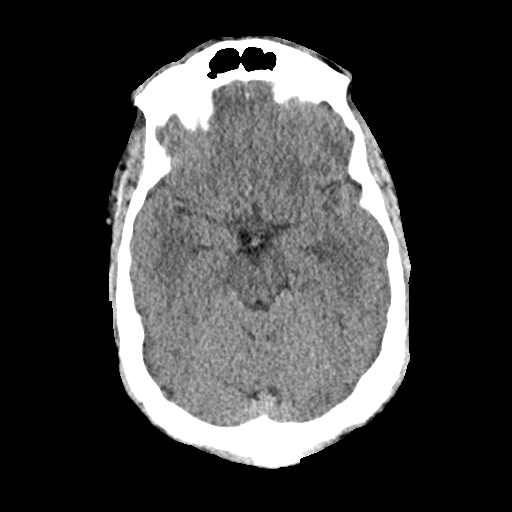
[im 16/35  brain]
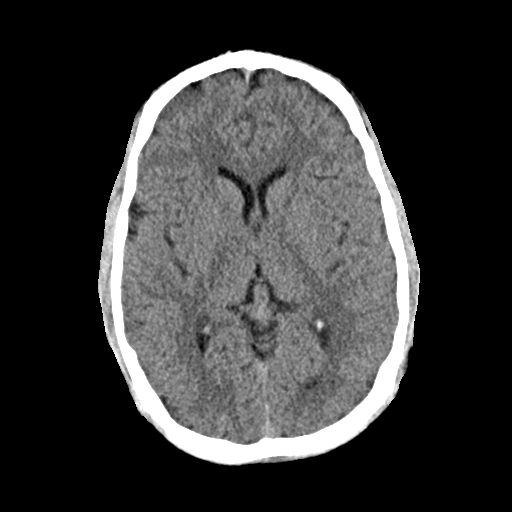
[im 16/35  bone]
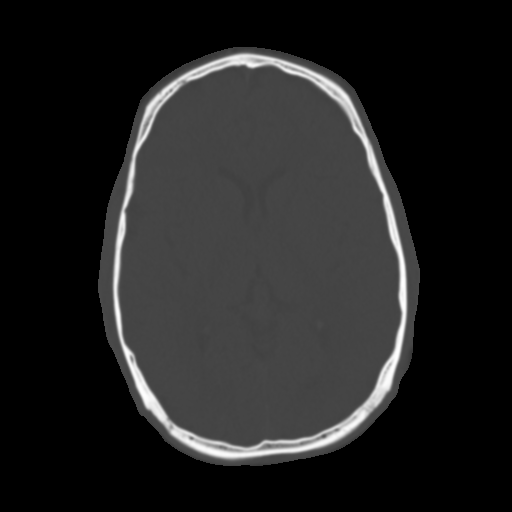
[im 19/35  brain]
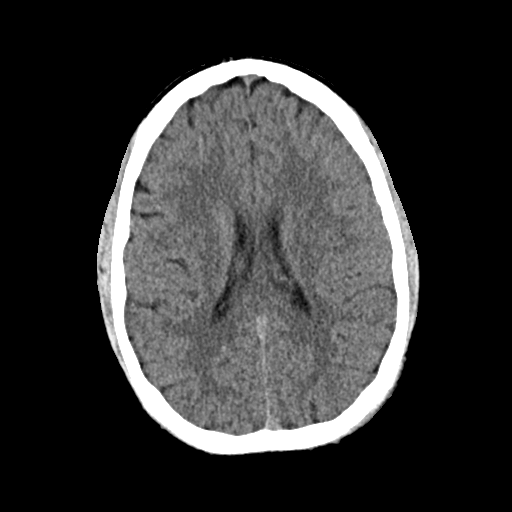
[im 23/35  brain]
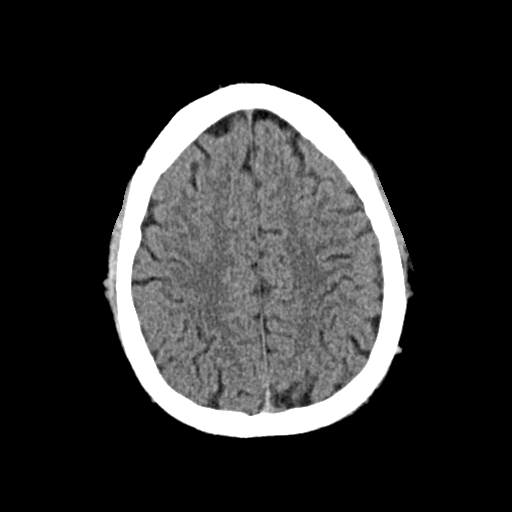
[im 26/35  brain]
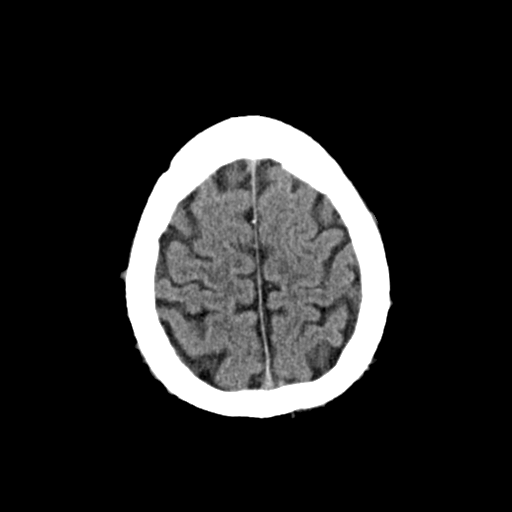
[im 29/35  brain]
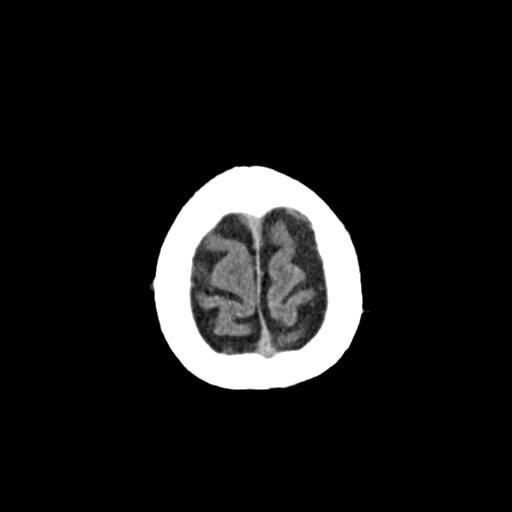
[im 29/35  bone]
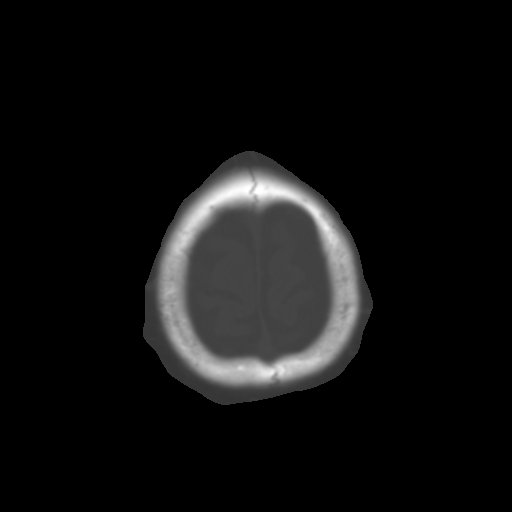
[im 32/35  brain]
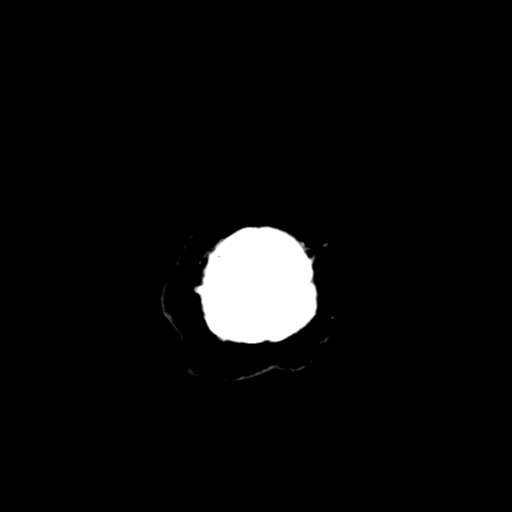

[Series 4: coronal soft · coronal · 0.35mm/px · 3 of 74 slices shown]
[im 25/74  brain]
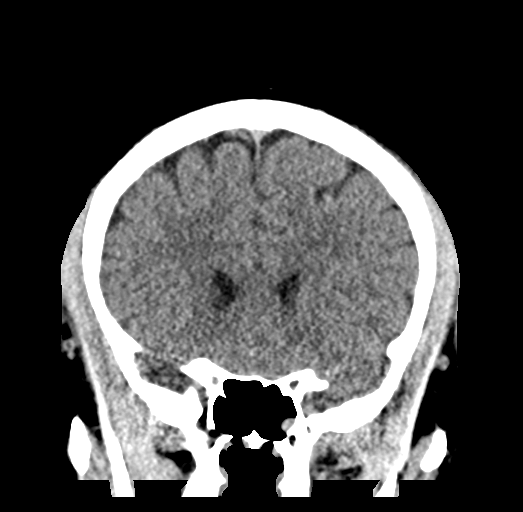
[im 33/74  brain]
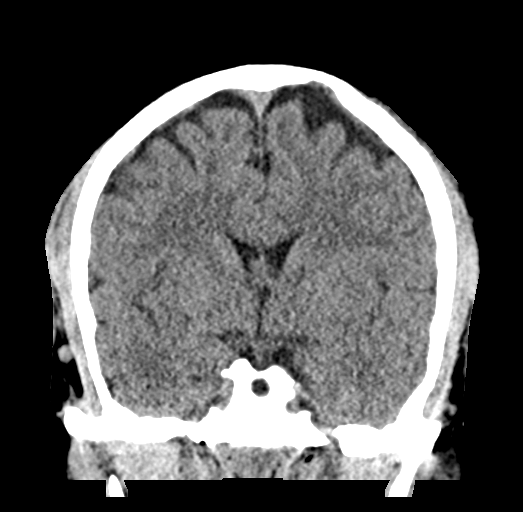
[im 41/74  brain]
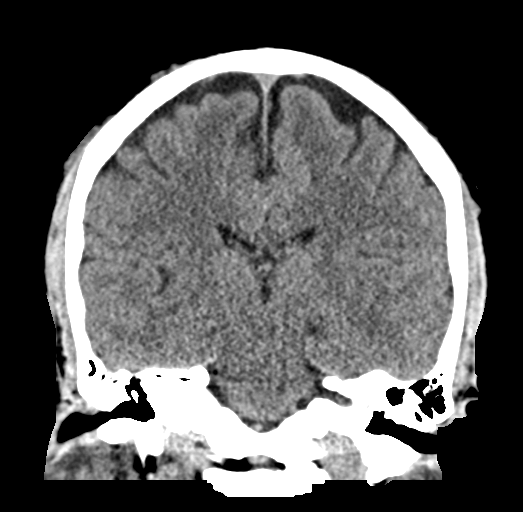

[Series 5: sag soft · sagittal · 0.35mm/px · 3 of 62 slices shown]
[im 21/62  brain]
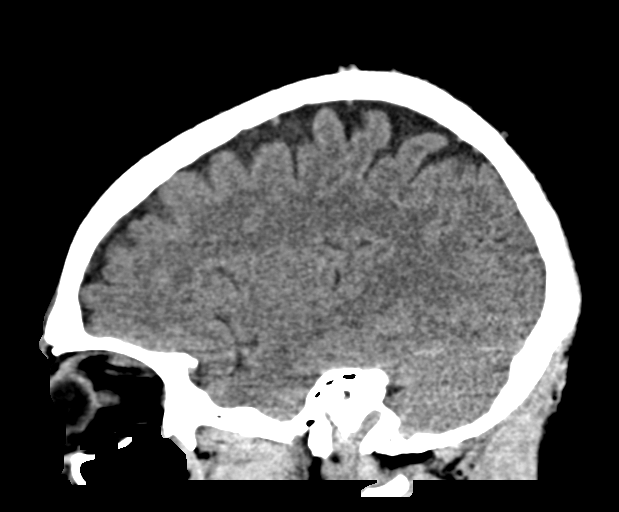
[im 31/62  brain]
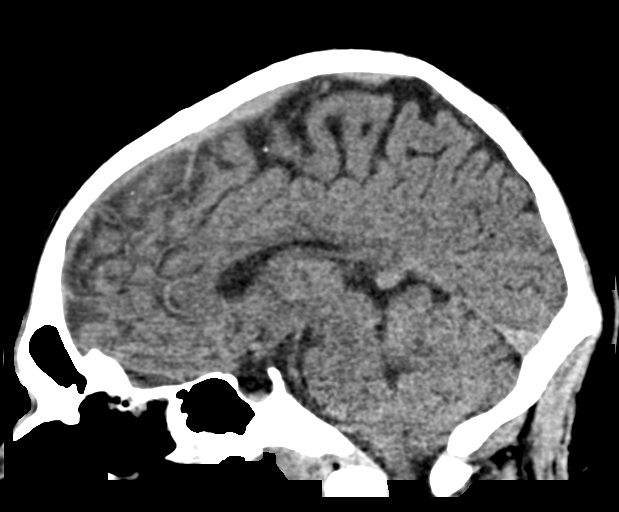
[im 41/62  brain]
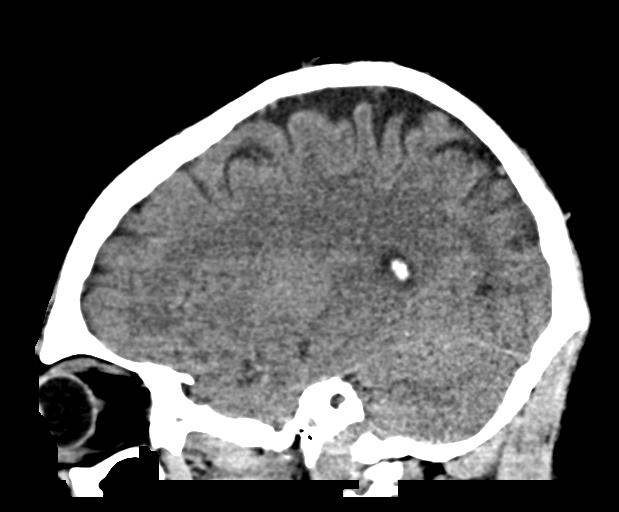

[16 of 47 positions shown; findings below may reference images not displayed]

FINDINGS: Brain: No acute intracranial abnormality. Specifically, no
hemorrhage, hydrocephalus, mass lesion, acute infarction, or
significant intracranial injury.

Vascular: No hyperdense vessel or unexpected calcification.

Skull: No acute calvarial abnormality.

Sinuses/Orbits: Visualized paranasal sinuses and mastoids clear.
Orbital soft tissues unremarkable.

Other: None
IMPRESSION: Normal study.

## 2019-05-28 IMAGING — DX CHEST - 2 VIEW
2 series · 2 of 2 positions shown · non-contrast
Comparison: None.

CLINICAL DATA: Smoker; no h/o heart or lung problems; pt has been
passing out for the past 2 weeks w/no warning; 05/08/2011

EXAM:
CHEST - 2 VIEW

[chest pa]
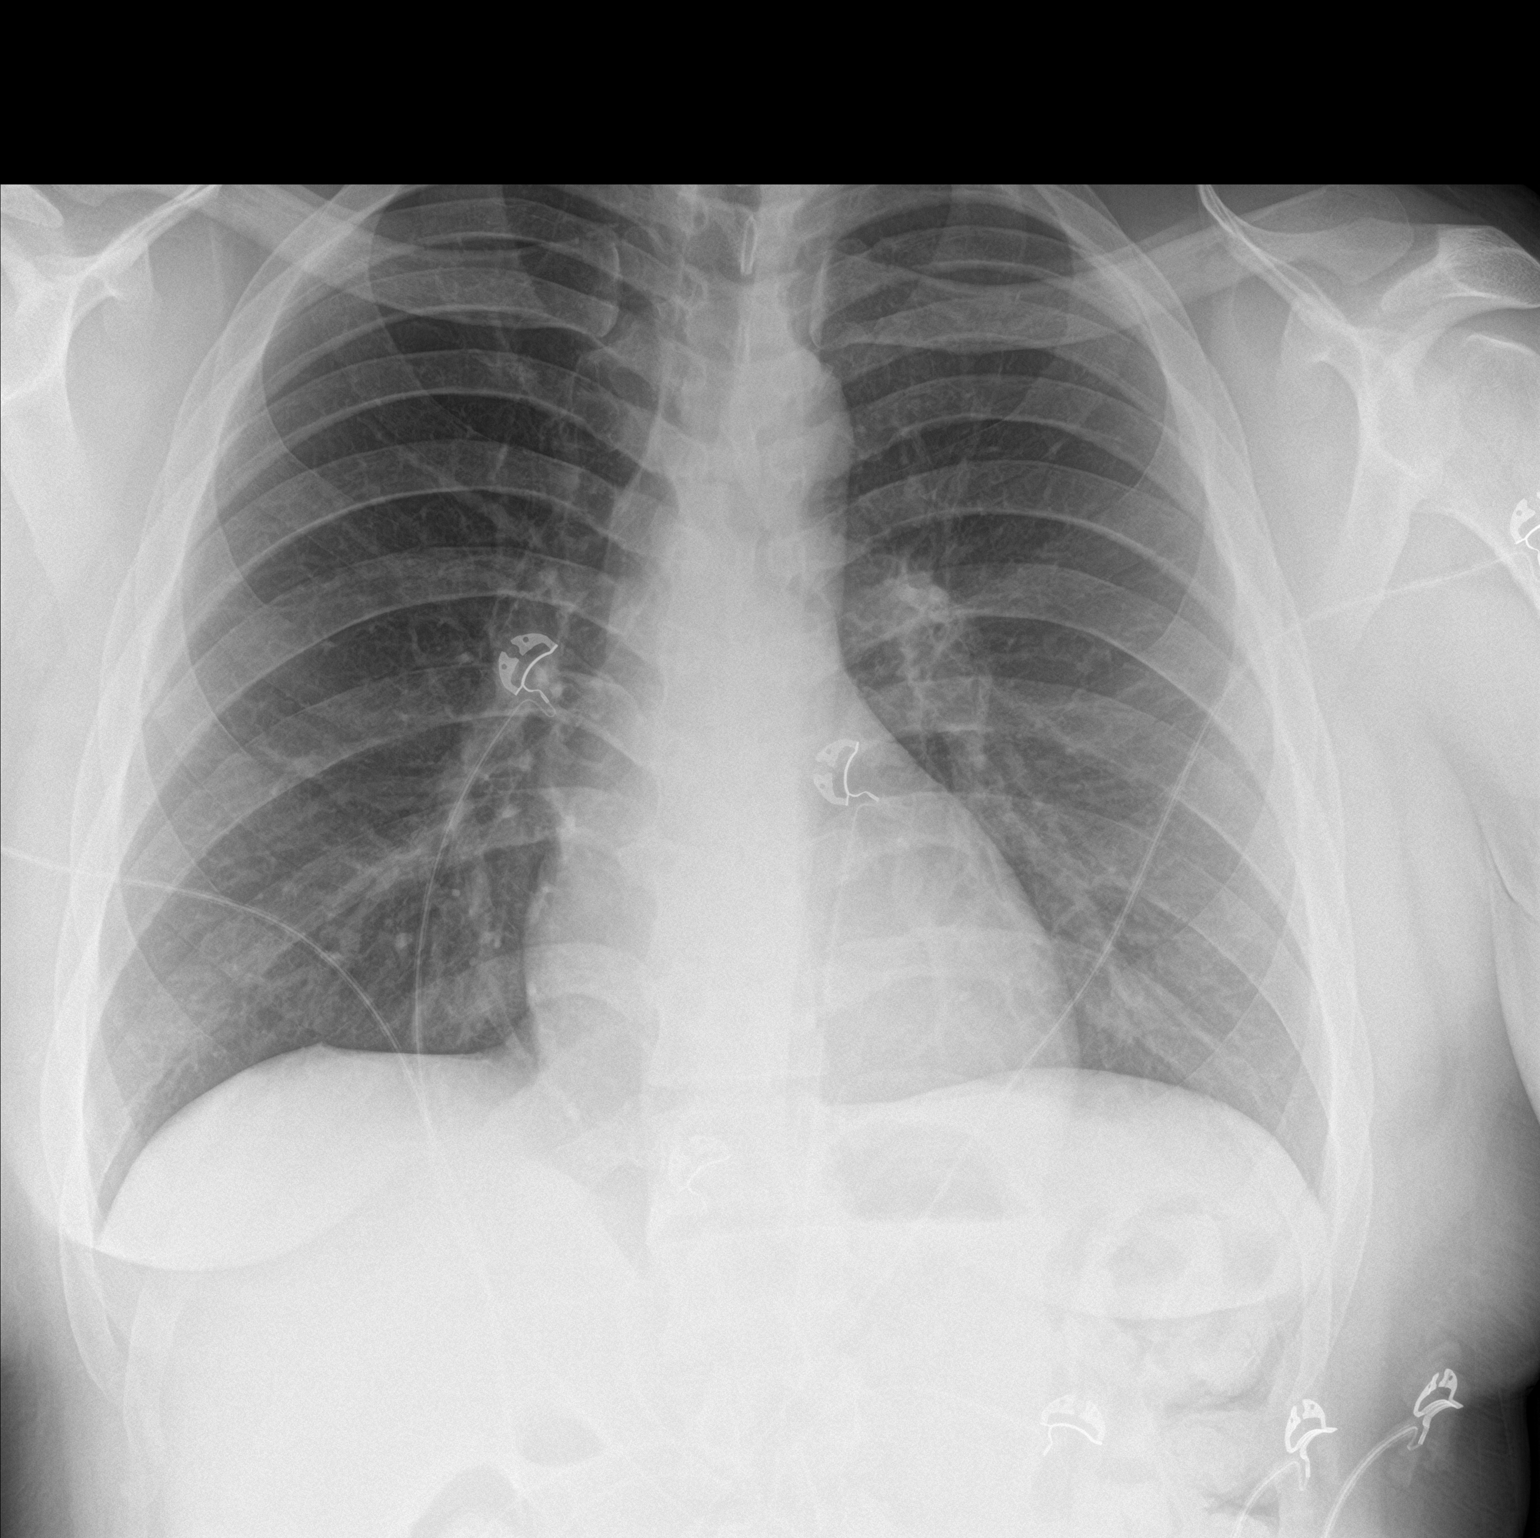

[chest lat]
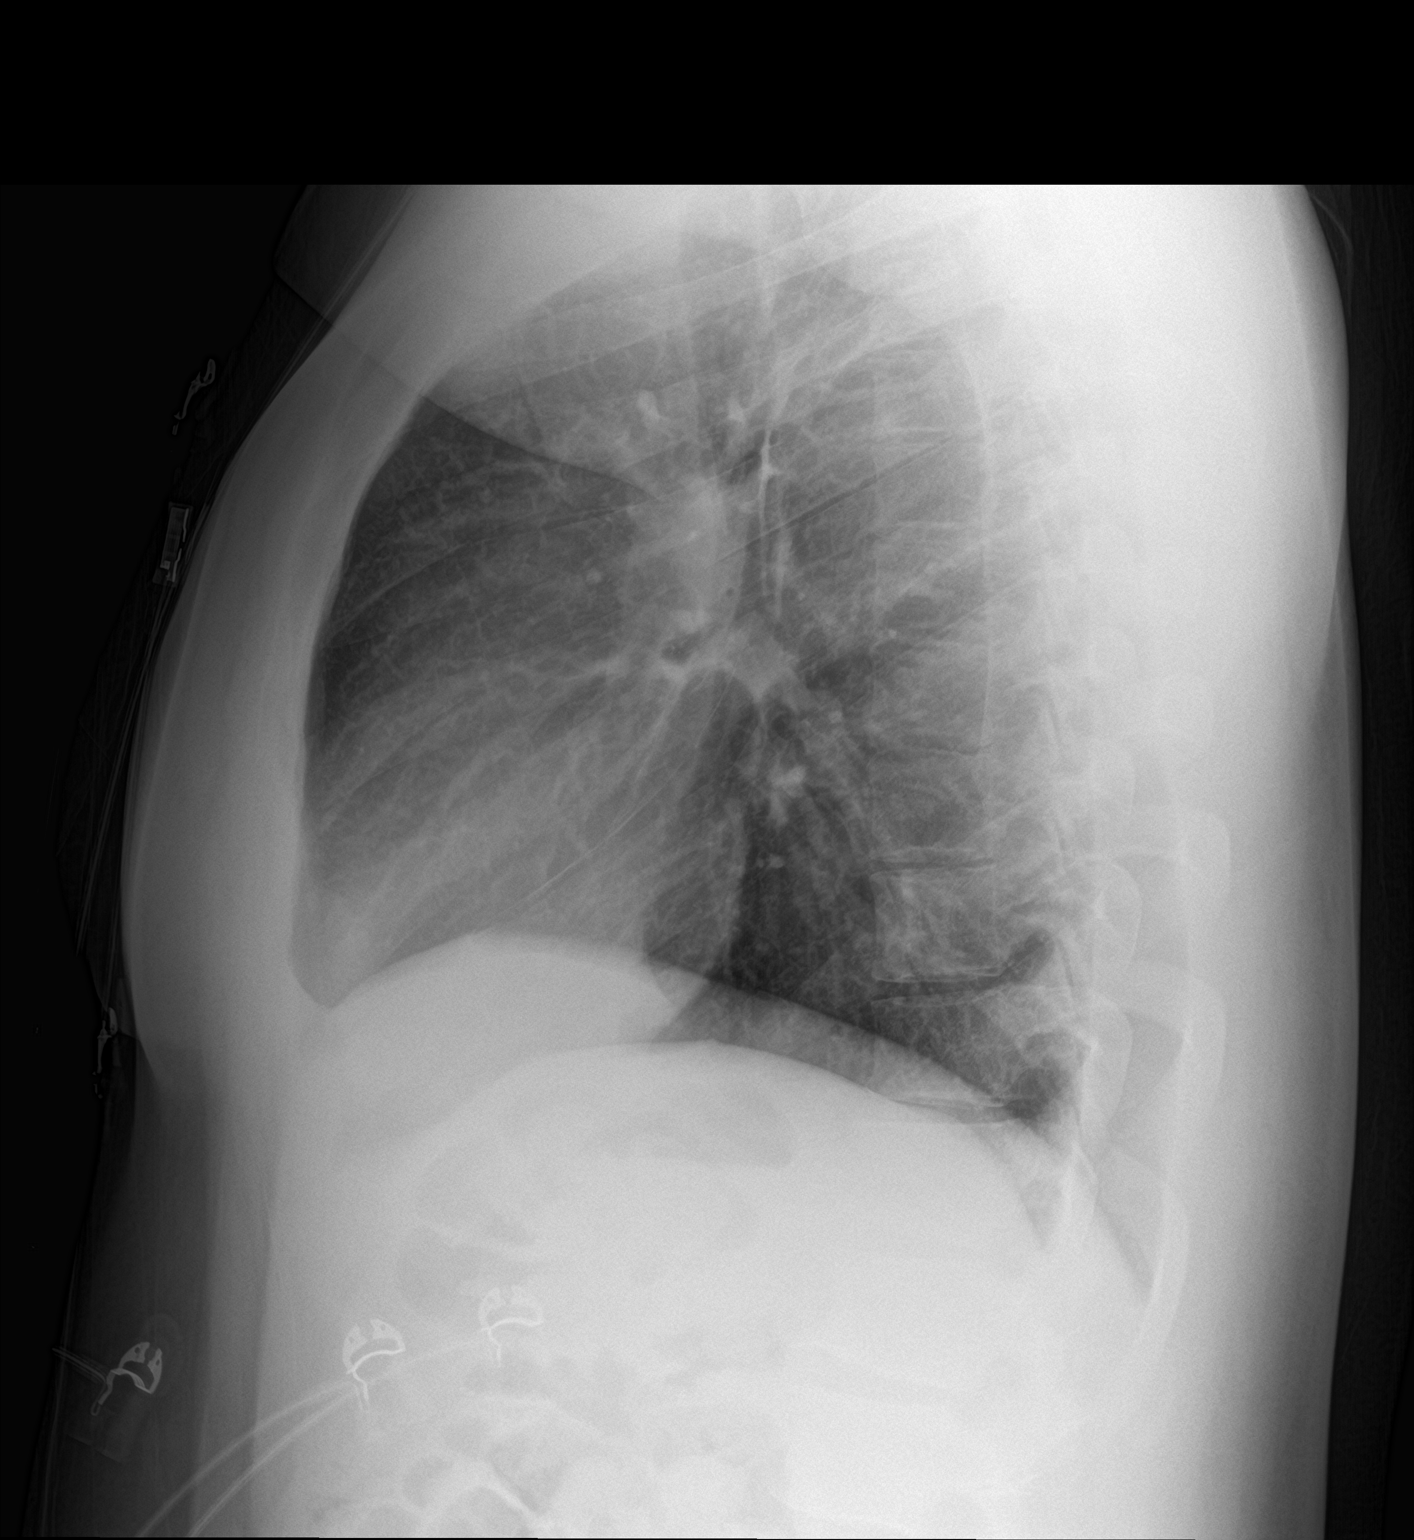

[2 of 2 positions shown; findings below may reference images not displayed]

FINDINGS: The heart size and mediastinal contours are within normal limits.
Both lungs are clear. No pleural effusion or pneumothorax. The
visualized skeletal structures are unremarkable.
IMPRESSION: Normal chest radiographs.
